# Patient Record
Sex: Male | Born: 1960
Health system: Southern US, Community
[De-identification: ages and names within clinical notes are randomized; demographics above are authoritative.]

## PROBLEM LIST (undated history)

## (undated) DIAGNOSIS — C73 Malignant neoplasm of thyroid gland: Secondary | ICD-10-CM

## (undated) DIAGNOSIS — F411 Generalized anxiety disorder: Secondary | ICD-10-CM

## (undated) DIAGNOSIS — Z96659 Presence of unspecified artificial knee joint: Secondary | ICD-10-CM

## (undated) DIAGNOSIS — R0989 Other specified symptoms and signs involving the circulatory and respiratory systems: Secondary | ICD-10-CM

## (undated) DIAGNOSIS — G4700 Insomnia, unspecified: Secondary | ICD-10-CM

## (undated) DIAGNOSIS — G4733 Obstructive sleep apnea (adult) (pediatric): Principal | ICD-10-CM

## (undated) DIAGNOSIS — I10 Essential (primary) hypertension: Secondary | ICD-10-CM

## (undated) DIAGNOSIS — M5432 Sciatica, left side: Secondary | ICD-10-CM

## (undated) DIAGNOSIS — M1990 Unspecified osteoarthritis, unspecified site: Secondary | ICD-10-CM

## (undated) DIAGNOSIS — D899 Disorder involving the immune mechanism, unspecified: Secondary | ICD-10-CM

## (undated) DIAGNOSIS — L4050 Arthropathic psoriasis, unspecified: Secondary | ICD-10-CM

## (undated) DIAGNOSIS — N529 Male erectile dysfunction, unspecified: Secondary | ICD-10-CM

## (undated) DIAGNOSIS — E785 Hyperlipidemia, unspecified: Secondary | ICD-10-CM

## (undated) DIAGNOSIS — E039 Hypothyroidism, unspecified: Secondary | ICD-10-CM

## (undated) HISTORY — DX: Hyperlipidemia, unspecified: E78.5

## (undated) HISTORY — DX: Arthropathic psoriasis, unspecified: L40.50

## (undated) HISTORY — PX: KNEE SURGERY: SHX244

## (undated) HISTORY — PX: LUMBAR DISC SURGERY: SHX700

## (undated) HISTORY — DX: Male erectile dysfunction, unspecified: N52.9

## (undated) HISTORY — DX: Unspecified osteoarthritis, unspecified site: M19.90

## (undated) HISTORY — DX: Insomnia, unspecified: G47.00

## (undated) HISTORY — DX: Other specified symptoms and signs involving the circulatory and respiratory systems: R09.89

## (undated) HISTORY — DX: Hypothyroidism, unspecified: E03.9

## (undated) HISTORY — DX: Presence of unspecified artificial knee joint: Z96.659

## (undated) HISTORY — DX: Essential (primary) hypertension: I10

## (undated) HISTORY — DX: Disorder involving the immune mechanism, unspecified: D89.9

## (undated) HISTORY — DX: Generalized anxiety disorder: F41.1

## (undated) HISTORY — DX: Sciatica, left side: M54.32

## (undated) HISTORY — DX: Malignant neoplasm of thyroid gland: C73

## (undated) HISTORY — DX: Obstructive sleep apnea (adult) (pediatric): G47.33

---
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
## 1998-06-23 HISTORY — PX: THYROIDECTOMY: SHX17

## 1998-06-26 ENCOUNTER — Encounter: Payer: Self-pay | Admitting: Endocrinology

## 1998-06-26 ENCOUNTER — Ambulatory Visit (HOSPITAL_COMMUNITY): Admission: RE | Admit: 1998-06-26 | Discharge: 1998-06-26 | Payer: Self-pay | Admitting: Endocrinology

## 1998-08-30 ENCOUNTER — Other Ambulatory Visit: Admission: RE | Admit: 1998-08-30 | Discharge: 1998-08-30 | Payer: Self-pay | Admitting: Endocrinology

## 1998-10-01 ENCOUNTER — Ambulatory Visit (HOSPITAL_COMMUNITY): Admission: RE | Admit: 1998-10-01 | Discharge: 1998-10-02 | Payer: Self-pay

## 1998-11-30 ENCOUNTER — Ambulatory Visit (HOSPITAL_COMMUNITY): Admission: RE | Admit: 1998-11-30 | Discharge: 1998-11-30 | Payer: Self-pay | Admitting: Endocrinology

## 1998-12-03 ENCOUNTER — Encounter: Payer: Self-pay | Admitting: Endocrinology

## 1998-12-04 ENCOUNTER — Ambulatory Visit (HOSPITAL_COMMUNITY): Admission: RE | Admit: 1998-12-04 | Discharge: 1998-12-04 | Payer: Self-pay | Admitting: Endocrinology

## 1998-12-04 ENCOUNTER — Encounter: Payer: Self-pay | Admitting: Endocrinology

## 1999-08-26 ENCOUNTER — Encounter: Payer: Self-pay | Admitting: Endocrinology

## 1999-08-26 ENCOUNTER — Ambulatory Visit (HOSPITAL_COMMUNITY): Admission: RE | Admit: 1999-08-26 | Discharge: 1999-08-26 | Payer: Self-pay | Admitting: Endocrinology

## 1999-08-30 ENCOUNTER — Ambulatory Visit (HOSPITAL_COMMUNITY): Admission: RE | Admit: 1999-08-30 | Discharge: 1999-08-30 | Payer: Self-pay | Admitting: Endocrinology

## 2003-06-24 HISTORY — PX: OTHER SURGICAL HISTORY: SHX169

## 2013-02-11 LAB — HM COLONOSCOPY: HM Colonoscopy: NORMAL

## 2013-06-23 HISTORY — PX: CERVICAL DISCECTOMY: SHX98

## 2014-02-17 ENCOUNTER — Encounter: Payer: Self-pay | Admitting: Family Medicine

## 2014-02-17 ENCOUNTER — Other Ambulatory Visit: Payer: Self-pay | Admitting: Family Medicine

## 2014-02-17 ENCOUNTER — Ambulatory Visit (INDEPENDENT_AMBULATORY_CARE_PROVIDER_SITE_OTHER): Payer: 59 | Admitting: Family Medicine

## 2014-02-17 VITALS — BP 138/78 | HR 96 | Temp 98.4°F | Ht 72.0 in | Wt 232.0 lb

## 2014-02-17 DIAGNOSIS — D849 Immunodeficiency, unspecified: Secondary | ICD-10-CM | POA: Insufficient documentation

## 2014-02-17 DIAGNOSIS — G4700 Insomnia, unspecified: Secondary | ICD-10-CM

## 2014-02-17 DIAGNOSIS — Z96659 Presence of unspecified artificial knee joint: Secondary | ICD-10-CM | POA: Insufficient documentation

## 2014-02-17 DIAGNOSIS — M5432 Sciatica, left side: Secondary | ICD-10-CM

## 2014-02-17 DIAGNOSIS — L4050 Arthropathic psoriasis, unspecified: Secondary | ICD-10-CM | POA: Insufficient documentation

## 2014-02-17 DIAGNOSIS — N529 Male erectile dysfunction, unspecified: Secondary | ICD-10-CM | POA: Insufficient documentation

## 2014-02-17 DIAGNOSIS — F411 Generalized anxiety disorder: Secondary | ICD-10-CM

## 2014-02-17 DIAGNOSIS — E039 Hypothyroidism, unspecified: Secondary | ICD-10-CM | POA: Insufficient documentation

## 2014-02-17 DIAGNOSIS — D899 Disorder involving the immune mechanism, unspecified: Secondary | ICD-10-CM

## 2014-02-17 DIAGNOSIS — I10 Essential (primary) hypertension: Secondary | ICD-10-CM | POA: Insufficient documentation

## 2014-02-17 DIAGNOSIS — M5430 Sciatica, unspecified side: Secondary | ICD-10-CM

## 2014-02-17 DIAGNOSIS — Z23 Encounter for immunization: Secondary | ICD-10-CM

## 2014-02-17 HISTORY — DX: Immunodeficiency, unspecified: D84.9

## 2014-02-17 HISTORY — DX: Sciatica, left side: M54.32

## 2014-02-17 HISTORY — DX: Generalized anxiety disorder: F41.1

## 2014-02-17 HISTORY — DX: Arthropathic psoriasis, unspecified: L40.50

## 2014-02-17 HISTORY — DX: Hypothyroidism, unspecified: E03.9

## 2014-02-17 HISTORY — DX: Insomnia, unspecified: G47.00

## 2014-02-17 HISTORY — DX: Male erectile dysfunction, unspecified: N52.9

## 2014-02-17 HISTORY — DX: Presence of unspecified artificial knee joint: Z96.659

## 2014-02-17 LAB — HM COLONOSCOPY: HM Colonoscopy: NORMAL

## 2014-02-17 MED ORDER — PREDNISONE 20 MG PO TABS: ORAL_TABLET | ORAL | Status: DC

## 2014-02-17 MED ORDER — LISINOPRIL 5 MG PO TABS: 5.0000 mg | ORAL_TABLET | Freq: Every day | ORAL | Status: DC

## 2014-02-17 MED ORDER — DULOXETINE HCL 60 MG PO CPEP: 60.0000 mg | ORAL_CAPSULE | Freq: Two times a day (BID) | ORAL | Status: DC

## 2014-02-17 MED ORDER — SILDENAFIL CITRATE 50 MG PO TABS: 50.0000 mg | ORAL_TABLET | Freq: Every day | ORAL | Status: DC | PRN

## 2014-02-17 NOTE — Progress Notes (Signed)
Jared Reddish, MD Phone: [phone]  Subjective:  Patient presents today to establish care with me as PCP. Formerly patient cared for in [place], [place]. Chief complaint-noted.   Patient continues to follow with his endocrinologist in [place] for his history thyroid cancer s/p resection. Follows with Dr. Ouida Sills at [facility] for rheumatology. Patient uncertain when he had his last tetanus shot but states at least 5 years ago. He has never had pneumovax while on Enbrel.   Sciatica Left sided in buttocks shooting into left leg. 3-4 weeks of symptoms. Was steadily improvement until patient overdid it last week with a very busy early week (golf, lots of travel). Being seen at [place]. Had discussed possible prednisone dose pack. Patient interested in trying that today ROS- no fecal or urinary incontinence, no saddle anesthesia, no leg weakness  Generalized Anxiety Disorder Stable for years on cymbalta 120mg . Never had CBT. USes xanax very sparingly (1x month max) ROS-no SI or HI  Hypertension BP Readings from Last 3 Encounters:  02/17/14 138/78  Home BP monitoring-no Compliant with medications-yes without side effects ROS-Denies any CP, HA, SOB, blurry vision, LE edema.  The following were reviewed and entered/updated in epic: Past Medical History  Diagnosis Date  . Arthritis   . Thyroid cancer     s/p removal, now hypothyroid  . Hypertension    Patient Active Problem List   Diagnosis Date Noted  . Immunosuppression 02/17/2014    Priority: High  . GAD (generalized anxiety disorder) 02/17/2014    Priority: Medium  . Psoriatic arthritis 02/17/2014    Priority: Medium  . Hypothyroidism 02/17/2014    Priority: Medium  . Insomnia 02/17/2014    Priority: Medium  . Essential hypertension, benign 02/17/2014    Priority: Medium  . Erectile dysfunction 02/17/2014    Priority: Low  . Total knee replacement status 02/17/2014    Priority: Low  .  Sciatica of left side 02/17/2014    Priority: Low   Past Surgical History  Procedure Laterality Date  . Thyroidectomy Bilateral 2000  . Knee surgery Right 1980-2011  . Shoulder surgery Right 1983  . Right foot Right 2005    Family History  Problem Relation Age of Onset  . Cancer Mother   . Hypertension Mother   . Hypertension Father     Medications- reviewed and updated Current Outpatient Prescriptions  Medication Sig Dispense Refill  . ALPRAZolam (XANAX) 0.25 MG tablet Take 0.25 mg by mouth at bedtime as needed for anxiety.      . DULoxetine (CYMBALTA) 60 MG capsule Take 1 capsule (60 mg total) by mouth 2 (two) times daily.  180 capsule  3  . etanercept (ENBREL SURECLICK) 50 MG/ML injection Inject 50 mg into the skin once a week.      . levothyroxine (SYNTHROID, LEVOTHROID) 125 MCG tablet Take 125 mcg by mouth daily before breakfast.      . lisinopril (PRINIVIL,ZESTRIL) 5 MG tablet Take 1 tablet (5 mg total) by mouth daily.  90 tablet  3  . zolpidem (AMBIEN) 10 MG tablet Take 10 mg by mouth at bedtime as needed for sleep.      . clindamycin (CLEOCIN) 150 MG capsule Take 150 mg by mouth as needed.      . predniSONE (DELTASONE) 20 MG tablet Take 2 pills for 3 days, then 1 pill for 3 days, then 1/2 pill for 3 days, then 1/2 pill every other day until finished.  14 tablet  0  . sildenafil (  VIAGRA) 50 MG tablet Take 1 tablet (50 mg total) by mouth daily as needed for erectile dysfunction.  10 tablet  5   No current facility-administered medications for this visit.   Allergies-reviewed and updated No Known Allergies  History   Social History  . Marital Status: Married    Spouse Name: N/A    Number of Children: N/A  . Years of Education: N/A   Social History Main Topics  . Smoking status: Never Smoker   . Smokeless tobacco: Never Used  . Alcohol Use: 4.0 - 5.0 oz/week    8-10 drink(s) per week  . Drug Use: No  . Sexual Activity: Yes    Partners: Female   Other Topics  Concern  . None   Social History Narrative   Married for 28 years in 2015. 3 kids-2 in [employer] day school, 1 at [employer]. One son hoping to play college baseball.       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 Oropharynx normal Neck: no thyromegaly, no lymphadenopathy CV: RRR no murmurs rubs or gallops Lungs: CTAB no crackles, wheeze, rhonchi Abdomen: soft/nontender/nondistended/normal bowel sounds. No rebound or guarding.  Ext: no edema Skin: warm, dry, no rash Neuro: PERRLA, strength 5/5 upper and lower extremities, normal reflexes  Assessment/Plan:  Sciatica of left side X 3-4 weeks. Seeing chiropractor. Trial prednisone taper. Consider imaging if red flags or if persists past 6 weeks  GAD (generalized anxiety disorder) Well controlled. Refill cymbalta. Will prescribe occasional xanax, if symptoms worsen, need to add CBT.   Obtain records from previous practice in [place] Next visit: review labs, discuss daily ASA, review need for statin  Orders Placed This Encounter  Procedures  . Tdap vaccine greater than or equal to 7yo IM    Meds ordered this encounter  Medications  . etanercept (ENBREL SURECLICK) 50 MG/ML injection    Sig: Inject 50 mg into the skin once a week.  [name] DISCONTD: DULoxetine (CYMBALTA) 60 MG capsule    Sig: Take 60 mg by mouth 2 (two) times daily.  . clindamycin (CLEOCIN) 150 MG capsule    Sig: Take 150 mg by mouth as needed.  [name] DISCONTD: lisinopril (PRINIVIL,ZESTRIL) 5 MG tablet    Sig: Take 5 mg by mouth daily.  [name] ALPRAZolam (XANAX) 0.25  MG tablet    Sig: Take 0.25 mg by mouth at bedtime as needed for anxiety.  [name] zolpidem (AMBIEN) 10 MG tablet    Sig: Take 10 mg by mouth at bedtime as needed for sleep.  [name] DISCONTD: sildenafil (VIAGRA) 50 MG tablet    Sig: Take 50 mg by mouth daily as needed for erectile dysfunction.  [name] levothyroxine (SYNTHROID, LEVOTHROID) 125 MCG tablet    Sig: Take 125 mcg by mouth daily before breakfast.  . predniSONE (DELTASONE) 20 MG tablet    Sig: Take 2 pills for 3 days, then 1 pill for 3 days, then 1/2 pill for 3 days, then 1/2 pill every other day until finished.  Dispense:  14 tablet    Refill:  0  . DULoxetine (CYMBALTA) 60 MG capsule    Sig: Take 1 capsule (60 mg total) by mouth 2 (two) times daily.    Dispense:  180 capsule    Refill:  3  . sildenafil (VIAGRA) 50 MG tablet    Sig: Take 1 tablet (50 mg total) by mouth daily as needed for erectile dysfunction.    Dispense:  10 tablet    Refill:  5  . lisinopril (PRINIVIL,ZESTRIL) 5 MG tablet    Sig: Take 1 tablet (5 mg total) by mouth daily.    Dispense:  90 tablet    Refill:  3

## 2014-02-17 NOTE — Assessment & Plan Note (Signed)
Well controlled. Refill cymbalta. Will prescribe occasional xanax, if symptoms worsen, need to add CBT.

## 2014-02-17 NOTE — Assessment & Plan Note (Signed)
X 3-4 weeks. Seeing chiropractor. Trial prednisone taper. Consider imaging if red flags or if persists past 6 weeks

## 2014-02-17 NOTE — Patient Instructions (Addendum)
Wonderful to meet you!   Prednisone taper prescribed for sciatica. Mobic is ok to take with this as well. Could cause some stomach upset/reflux-zantac would be reasonable if this occurs.   Refilled-cymbalta, viagra, lasix  TDAP today  Front desk-schedule 1 year appointment, ask for medical records-(need office visits, immunizations, labs, colonoscopy, summary sheet if available for last 3 years)  TIps for losing weight 1. Cut out sugar substitutes (coke zero, stevia) as may be driving your after dinner snacking 2. 150 minutes exercise per week 3. Reduce post meal snacking by 50% or replace foods with healthy options such as fruits, veggies  Goal weight 210-220 (BMI under 30)  Return in 1 year (or 6 months to check in on weight)

## 2014-02-17 NOTE — Assessment & Plan Note (Signed)
Well controlled. Refilled lisinopril. Obtain records with labs.

## 2014-03-10 ENCOUNTER — Other Ambulatory Visit: Payer: Self-pay | Admitting: Sports Medicine

## 2014-03-10 DIAGNOSIS — M545 Low back pain: Secondary | ICD-10-CM

## 2014-03-19 ENCOUNTER — Ambulatory Visit
Admission: RE | Admit: 2014-03-19 | Discharge: 2014-03-19 | Disposition: A | Discharge status: Home / Self Care | Payer: 59 | Source: Ambulatory Visit | Attending: Sports Medicine | Admitting: Sports Medicine

## 2014-03-19 DIAGNOSIS — M545 Low back pain: Secondary | ICD-10-CM

## 2014-04-11 ENCOUNTER — Encounter: Payer: Self-pay | Admitting: Family Medicine

## 2015-01-19 ENCOUNTER — Other Ambulatory Visit: Payer: Self-pay | Admitting: Family Medicine

## 2015-02-15 ENCOUNTER — Ambulatory Visit (INDEPENDENT_AMBULATORY_CARE_PROVIDER_SITE_OTHER): Payer: 59 | Admitting: Family Medicine

## 2015-02-15 ENCOUNTER — Encounter: Payer: Self-pay | Admitting: Family Medicine

## 2015-02-15 VITALS — BP 110/74 | HR 87 | Temp 98.5°F | Ht 72.0 in | Wt 226.0 lb

## 2015-02-15 DIAGNOSIS — Z0000 Encounter for general adult medical examination without abnormal findings: Secondary | ICD-10-CM | POA: Diagnosis not present

## 2015-02-15 DIAGNOSIS — R7989 Other specified abnormal findings of blood chemistry: Secondary | ICD-10-CM | POA: Diagnosis not present

## 2015-02-15 LAB — PSA: PSA: 1.59 ng/mL (ref 0.10–4.00)

## 2015-02-15 LAB — COMPREHENSIVE METABOLIC PANEL
ALK PHOS: 80 U/L (ref 39–117)
ALT: 33 U/L (ref 0–53)
AST: 25 U/L (ref 0–37)
Albumin: 4.7 g/dL (ref 3.5–5.2)
BILIRUBIN TOTAL: 0.8 mg/dL (ref 0.2–1.2)
BUN: 18 mg/dL (ref 6–23)
CALCIUM: 9.9 mg/dL (ref 8.4–10.5)
CO2: 32 meq/L (ref 19–32)
Chloride: 99 mEq/L (ref 96–112)
Creatinine, Ser: 1.24 mg/dL (ref 0.40–1.50)
GFR: 65.03 mL/min (ref >60.00)
Glucose, Bld: 98 mg/dL (ref 70–99)
Potassium: 4.6 mEq/L (ref 3.5–5.1)
Sodium: 140 mEq/L (ref 135–145)
Total Protein: 7.6 g/dL (ref 6.0–8.3)

## 2015-02-15 LAB — CBC
HCT: 47.2 % (ref 39.0–52.0)
HEMOGLOBIN: 16.2 g/dL (ref 13.0–17.0)
MCHC: 34.2 g/dL (ref 30.0–36.0)
MCV: 89.6 fl (ref 78.0–100.0)
PLATELETS: 252 10*3/uL (ref 150.0–400.0)
RBC: 5.27 Mil/uL (ref 4.22–5.81)
RDW: 12.4 % (ref 11.5–15.5)
WBC: 5 10*3/uL (ref 4.0–10.5)

## 2015-02-15 LAB — LIPID PANEL
Cholesterol: 243 mg/dL — ABNORMAL HIGH (ref 0–200)
HDL: 51 mg/dL (ref >39.00)
NonHDL: 192.13
TRIGLYCERIDES: 279 mg/dL — AB (ref 0.0–149.0)
Total CHOL/HDL Ratio: 5
VLDL: 55.8 mg/dL — ABNORMAL HIGH (ref 0.0–40.0)

## 2015-02-15 LAB — LDL CHOLESTEROL, DIRECT: Direct LDL: 152 mg/dL

## 2015-02-15 LAB — TSH: TSH: 0.24 u[IU]/mL — ABNORMAL LOW (ref 0.35–4.50)

## 2015-02-15 MED ORDER — DULOXETINE HCL 60 MG PO CPEP: 60.0000 mg | ORAL_CAPSULE | Freq: Two times a day (BID) | ORAL | Status: DC

## 2015-02-15 MED ORDER — LISINOPRIL 5 MG PO TABS: 5.0000 mg | ORAL_TABLET | Freq: Every day | ORAL | Status: DC

## 2015-02-15 MED ORDER — ALPRAZOLAM 0.25 MG PO TABS: 0.2500 mg | ORAL_TABLET | Freq: Every evening | ORAL | Status: DC | PRN

## 2015-02-15 MED ORDER — SILDENAFIL CITRATE 50 MG PO TABS: 50.0000 mg | ORAL_TABLET | Freq: Every day | ORAL | Status: DC | PRN

## 2015-02-15 MED ORDER — ZOLPIDEM TARTRATE 10 MG PO TABS: 10.0000 mg | ORAL_TABLET | Freq: Every evening | ORAL | Status: DC | PRN

## 2015-02-15 NOTE — Patient Instructions (Signed)
Medication Instructions:  Same, see list  Other Instructions:  Continue your weight loss journey. Congrats on being 6 lbs down. Continue journey to around 210 with 5 lb increments  Sign release of information at the front desk for colonoscopy report   Labwork: Today before you leave  Testing/Procedures/Immunizations: Flu shot at work- send [place] a message when you get this  Follow-Up (all visit scheduling, rescheduling, cancellations including labs should be scheduled at front desk): 1 year as long as you are checking BP at home at least once a month and under 140/90 and as long as no other issues come up.

## 2015-02-15 NOTE — Progress Notes (Signed)
Jared Reddish, MD Phone: [phone]  Subjective:  Patient presents today for their annual physical. Chief complaint-noted.   Fasting today- black coffee.   Sister recently diagnosed with diabetes. Wants fasting CBG check  Still goes back to [place] once a year check s/p thyroid cancer removal. On levothyroxine through their office  BP controlled- compliant with lisinopril  Considered reducing cymbalta but stress still very high in corporate [place] at [employer]. Xanax once a month usually.   Ambien primarily with travel  ROS- full  review of systems was completed and negative  Pertinent negatives- no chest pain, shortness of breath, headaches, blurry vision  The following were reviewed and entered/updated in epic: Past Medical History  Diagnosis Date  . Arthritis   . Thyroid cancer     s/p removal, now hypothyroid  . Hypertension    Patient Active Problem List   Diagnosis Date Noted  . Immunosuppression 02/17/2014    Priority: High  . GAD (generalized anxiety disorder) 02/17/2014    Priority: Medium  . Psoriatic arthritis 02/17/2014    Priority: Medium  . Hypothyroidism 02/17/2014    Priority: Medium  . Insomnia 02/17/2014    Priority: Medium  . Essential hypertension, benign 02/17/2014    Priority: Medium  . Erectile dysfunction 02/17/2014    Priority: Low  . Total knee replacement status 02/17/2014    Priority: Low  . Sciatica of left side 02/17/2014    Priority: Low   Past Surgical History  Procedure Laterality Date  . Thyroidectomy Bilateral 2000  . Knee surgery Right 1980-2011  . Shoulder surgery Right 1983  . Right foot Right 2005  . Cervical discectomy  2015  . Lumbar disc surgery      l4-l5, l5-s1- ruptured disc. Dr. Ellene Route    Family History  Problem Relation Age of Onset  . Cancer Mother   . Hypertension Mother   . Hypertension Father   . Diabetes Sister     Medications- reviewed and updated Current Outpatient Prescriptions  Medication  Sig Dispense Refill  . ALPRAZolam (XANAX) 0.25 MG tablet Take 0.25 mg by mouth at bedtime as needed for anxiety.    . DULoxetine (CYMBALTA) 60 MG capsule Take 1 capsule (60 mg total) by mouth 2 (two) times daily. 180 capsule 3  . etanercept (ENBREL SURECLICK) 50 MG/ML injection Inject 50 mg into the skin once a week.    . levothyroxine (SYNTHROID, LEVOTHROID) 125 MCG tablet Take 125 mcg by mouth daily before breakfast.    . lisinopril (PRINIVIL,ZESTRIL) 5 MG tablet TAKE 1 TABLET DAILY 90 tablet 2  . clindamycin (CLEOCIN) 150 MG capsule Take 150 mg by mouth as needed.    . sildenafil (VIAGRA) 50 MG tablet Take 1 tablet (50 mg total) by mouth daily as needed for erectile dysfunction. (Patient not taking: Reported on 02/15/2015) 10 tablet 5  . zolpidem (AMBIEN) 10 MG tablet Take 10 mg by mouth at bedtime as needed for sleep.     Allergies-reviewed and updated No Known Allergies  Social History   Social History  . Marital Status: Married    Spouse Name: N/A  . Number of Children: N/A  . Years of Education: N/A   Social History Main Topics  . Smoking status: Never Smoker   . Smokeless tobacco: Never Used  . Alcohol Use: 4.0 - 5.0 oz/week    8-10 drink(s) per week  . Drug Use: No  . Sexual Activity:    Partners: Female   Other Topics Concern  .  None   Social History Narrative   Married for 29 years in 2016. 3 kids-1 in [employer] day school, 1 at [employer], 1 at [employer]- injury  Preventing from playing baseball.       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 Oropharynx normal Neck: no thyromegaly (no thyroid present) CV: RRR no murmurs rubs or gallops Lungs: CTAB no  crackles, wheeze, rhonchi Abdomen: soft/nontender/nondistended/normal bowel sounds. No rebound or guarding.  Rectal: normal tone, normal prostate, no masses or tenderness Ext: no edema, 2+ PT pulses Skin: warm, dry, no rash. Trunk skin exam without obvious BCC, SCC, AK, melanoma Neuro: grossly normal, moves all extremities, PERRLA  Assessment/Plan:  54 y.o. male presenting for annual physical.  Health Maintenance counseling: 1. Anticipatory guidance: Patient counseled regarding regular dental exams, wearing seatbelts, wearing sunscreen. Will have skin check in February with dermatologist.  2. Risk factor reduction:  Advised patient of need for regular exercise and diet rich and fruits and vegetables to reduce risk of heart attack and stroke. 6 lbs down- worked on portion size, working on having some more protein in AM, no late night snacking. Walking a lot.  3. Immunizations/screenings/ancillary studies Health Maintenance Due  Topic Date Due  . Hepatitis C Screening - declined 07[birth year]62  . HIV Screening - declined 12/20/1977  4. Prostate cancer screening- PSA today and rectal exam (rectal low risk). Still opts every other year screening 5. Colonoscopy Dr. Mertie Clause in [place] 02/17/14- normal, was told 10 years. No family history.   See HPI as well 1 year f/u as long as monitoring home BPs and <140/90  Orders Placed This Encounter  Procedures  . CBC    [place]  . Comprehensive metabolic panel    [place]    Order Specific Question:  Has the patient fasted?    Answer:  No  . Lipid panel    [place]    Order Specific Question:  Has the patient fasted?    Answer:  No  . PSA  . TSH    [place]    Meds ordered this encounter  Medications  . zolpidem (AMBIEN) 10 MG tablet    Sig: Take 1 tablet (10 mg total) by mouth at bedtime as needed for sleep.    Dispense:  30 tablet    Refill:  0  . ALPRAZolam (XANAX) 0.25 MG tablet    Sig: Take 1 tablet (0.25 mg total) by mouth at bedtime  as needed for anxiety.    Dispense:  30 tablet    Refill:  0  . lisinopril (PRINIVIL,ZESTRIL) 5 MG tablet    Sig: Take 1 tablet (5 mg total) by mouth daily.    Dispense:  90 tablet    Refill:  3  . sildenafil (VIAGRA) 50 MG tablet    Sig: Take 1 tablet (50 mg total) by mouth daily as needed for erectile dysfunction.    Dispense:  10 tablet    Refill:  5  . DULoxetine (CYMBALTA) 60 MG capsule    Sig: Take 1 capsule (60 mg total) by mouth 2 (two) times daily.  Dispense:  180 capsule    Refill:  3

## 2015-02-16 ENCOUNTER — Encounter: Payer: 59 | Admitting: Family Medicine

## 2015-02-27 ENCOUNTER — Encounter: Payer: Self-pay | Admitting: Family Medicine

## 2015-07-30 ENCOUNTER — Other Ambulatory Visit: Payer: Self-pay

## 2015-07-30 NOTE — Telephone Encounter (Signed)
Patient has requested refill on Ambien. Last refilled on 02/15/15 for #30 with 0 refills. Ok to refill this medication?

## 2015-07-30 NOTE — Telephone Encounter (Signed)
Refill ok? 

## 2015-07-31 MED ORDER — ZOLPIDEM TARTRATE 10 MG PO TABS: 10.0000 mg | ORAL_TABLET | Freq: Every evening | ORAL | Status: DC | PRN

## 2015-07-31 NOTE — Telephone Encounter (Signed)
I called [facility] and they stated the Rx cannot be called in for the pt.  The Rx was printed and given to Dr Inda Merlin to sign for Dr Yong Channel and this was faxed to [facility] at

## 2015-07-31 NOTE — Telephone Encounter (Signed)
Dr Yong Channel called [facility] and approved a refill and this was called to the pts pharmacy.

## 2015-08-22 LAB — CBC AND DIFFERENTIAL
HEMATOCRIT: 45 % (ref 41–53)
HEMOGLOBIN: 15.6 g/dL (ref 13.5–17.5)
PLATELETS: 230 10*3/uL (ref 150–399)
WBC: 6 10*3/mL

## 2015-08-22 LAB — HEPATIC FUNCTION PANEL
ALT: 29 U/L (ref 10–40)
AST: 30 U/L (ref 14–40)
Alkaline Phosphatase: 76 U/L (ref 25–125)
BILIRUBIN, TOTAL: 0.4 mg/dL

## 2015-08-22 LAB — BASIC METABOLIC PANEL
BUN: 21 mg/dL (ref 4–21)
Creatinine: 1.3 mg/dL (ref 0.6–1.3)

## 2015-08-24 ENCOUNTER — Encounter: Payer: Self-pay | Admitting: Family Medicine

## 2015-11-14 ENCOUNTER — Other Ambulatory Visit: Payer: Self-pay

## 2015-11-14 NOTE — Telephone Encounter (Signed)
PT IS REQUESTING A REFILL. LAST REFILL 07/31/15. LAST OV 02/15/15. OK TO REFILL?

## 2015-11-14 NOTE — Telephone Encounter (Signed)
Yes thanks 

## 2015-11-15 MED ORDER — ZOLPIDEM TARTRATE 10 MG PO TABS: 10.0000 mg | ORAL_TABLET | Freq: Every evening | ORAL | Status: DC | PRN

## 2015-11-15 NOTE — Telephone Encounter (Signed)
Pt requested rx be called to [facility].  I did so, pt advised and voiced understanding.

## 2015-11-20 ENCOUNTER — Other Ambulatory Visit: Payer: Self-pay | Admitting: *Deleted

## 2015-11-20 NOTE — Telephone Encounter (Signed)
Yes thanks may refill 

## 2015-11-21 ENCOUNTER — Ambulatory Visit (HOSPITAL_COMMUNITY)
Admission: RE | Admit: 2015-11-21 | Discharge: 2015-11-21 | Disposition: A | Discharge status: Home / Self Care | Payer: 59 | Source: Ambulatory Visit | Attending: Internal Medicine | Admitting: Internal Medicine

## 2015-11-21 VITALS — BP 132/78 | HR 100 | Ht 72.0 in | Wt 226.8 lb

## 2015-11-21 DIAGNOSIS — Z880 Allergy status to penicillin: Secondary | ICD-10-CM | POA: Diagnosis not present

## 2015-11-21 DIAGNOSIS — E8881 Metabolic syndrome: Secondary | ICD-10-CM | POA: Insufficient documentation

## 2015-11-21 DIAGNOSIS — R0683 Snoring: Secondary | ICD-10-CM | POA: Insufficient documentation

## 2015-11-21 DIAGNOSIS — Z833 Family history of diabetes mellitus: Secondary | ICD-10-CM | POA: Insufficient documentation

## 2015-11-21 DIAGNOSIS — Z79899 Other long term (current) drug therapy: Secondary | ICD-10-CM | POA: Diagnosis not present

## 2015-11-21 DIAGNOSIS — E785 Hyperlipidemia, unspecified: Secondary | ICD-10-CM

## 2015-11-21 DIAGNOSIS — E890 Postprocedural hypothyroidism: Secondary | ICD-10-CM | POA: Diagnosis not present

## 2015-11-21 DIAGNOSIS — Z85850 Personal history of malignant neoplasm of thyroid: Secondary | ICD-10-CM | POA: Insufficient documentation

## 2015-11-21 DIAGNOSIS — N529 Male erectile dysfunction, unspecified: Secondary | ICD-10-CM | POA: Diagnosis not present

## 2015-11-21 DIAGNOSIS — Z136 Encounter for screening for cardiovascular disorders: Secondary | ICD-10-CM | POA: Insufficient documentation

## 2015-11-21 DIAGNOSIS — I10 Essential (primary) hypertension: Secondary | ICD-10-CM | POA: Diagnosis not present

## 2015-11-21 DIAGNOSIS — Z8249 Family history of ischemic heart disease and other diseases of the circulatory system: Secondary | ICD-10-CM | POA: Insufficient documentation

## 2015-11-21 DIAGNOSIS — L4050 Arthropathic psoriasis, unspecified: Secondary | ICD-10-CM | POA: Insufficient documentation

## 2015-11-21 MED ORDER — ASPIRIN EC 81 MG PO TBEC: 81.0000 mg | DELAYED_RELEASE_TABLET | Freq: Every day | ORAL | Status: AC

## 2015-11-21 MED ORDER — ROSUVASTATIN CALCIUM 20 MG PO TABS: 20.0000 mg | ORAL_TABLET | Freq: Every day | ORAL | Status: DC

## 2015-11-21 NOTE — Patient Instructions (Signed)
Start Aspirin 81 mg daily  Start Crestor 20 mg daily  Your physician has requested that you have an exercise tolerance test. For further information please visit [email or web address]. Please also follow instruction sheet, as given.  Calcium Scoring CT Scan  Your physician recommends that you schedule a follow-up appointment in: 4-6 weeks

## 2015-11-21 NOTE — Progress Notes (Signed)
Patient ID: SEAVER FOSSETT, male   DOB: 09-14-1960, 55 y.o.   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    Has been treated for HTN for 15-20 years. BP relatively well controlled unless under stress. No known heart disease. Never had stress test or cardiac cath. Colleague at [facility] recently with large MI and wanted to get screened.   Used to be very active playing sports. No says he is a "weekend warrior." Does vigorous yard work without CP or SOB but tires easily after 1 hour and can feel sick to his stomach. Occasional palpitations at night. + snoring. Takes sildenafil for ED.   No premature heart disease. Father with with ruptured AAA at 81 y/o.   Last lipids (8/16) - didn't want to start statin yet. Wanted to try weight loss.  TC 243 TG 279 HDL 51 LDL 152   Review of Systems: [y] = yes, [ ]  = no   General: Weight gain [ ] ; Weight loss [ ] ; Anorexia [ ] ; Fatigue [ ] ; Fever [ ] ; Chills [ ] ; Weakness [ ]   Cardiac: Chest pain/pressure [ ] ; Resting SOB [ ] ; Exertional SOB [ ] ; Orthopnea [ ] ; Pedal Edema [ ] ; Palpitations [ ] ; Syncope [ ] ; Presyncope [ ] ; Paroxysmal nocturnal dyspnea[ ]   Pulmonary: Cough [ ] ; Wheezing[ ] ; Hemoptysis[ ] ; Sputum [ ] ; Snoring [ ]   GI: Vomiting[ ] ; Dysphagia[ ] ; Melena[ ] ; Hematochezia [ ] ; Heartburn[ ] ; Abdominal pain [ ] ; Constipation [ ] ; Diarrhea [ ] ; BRBPR [ ]   GU: Hematuria[ ] ; Dysuria [ ] ; Nocturia[ ]   Vascular: Pain in legs with walking [ ] ; Pain in feet with lying flat [ ] ; Non-healing sores [ ] ; Stroke [ ] ; TIA [ ] ; Slurred speech [ ] ;  Neuro: Headaches[ ] ; Vertigo[ ] ; Seizures[ ] ; Paresthesias[ ] ;Blurred vision [ ] ; Diplopia [ ] ; Vision changes [ ]   Ortho/Skin: Arthritis [ y]; Joint pain Blue.Reese ]; Muscle pain [ ] ;  Joint swelling [ ] ; Back Pain [ ] ; Rash [ ]   Psych: Depression[ ] ; Anxiety[ ]   Heme: Bleeding problems [ ] ; Clotting disorders [ ] ; Anemia [ ]   Endocrine: Diabetes [ ] ; Thyroid dysfunction[y ]   Past Medical History  Diagnosis Date  . Arthritis   . Thyroid cancer     s/p removal, now hypothyroid  . Hypertension     Current Outpatient Prescriptions  Medication Sig Dispense Refill  . ALPRAZolam (XANAX) 0.25 MG tablet Take 1 tablet (0.25 mg total) by mouth at bedtime as needed for anxiety. 30 tablet 0  . Calcium Carbonate-Vitamin D (CALCIUM-VITAMIN D) 500-200 MG-UNIT tablet Take 1 tablet by mouth daily.    . cholecalciferol (VITAMIN D) 1000 units tablet Take 2,000 Units by mouth daily.    . clindamycin (CLEOCIN) 150 MG capsule Take 150 mg by mouth as needed.    . DULoxetine (CYMBALTA) 60 MG capsule Take 1 capsule (60 mg total) by mouth 2 (two) times daily. 180 capsule 3  . etanercept (ENBREL SURECLICK) 50 MG/ML injection Inject 50 mg into the skin once a week.    . levothyroxine (SYNTHROID, LEVOTHROID) 125 MCG tablet Take 125 mcg by mouth 2 (two) times daily.    [name] lisinopril (PRINIVIL,ZESTRIL) 5 MG tablet Take 1 tablet (  5 mg total) by mouth daily. 90 tablet 3  . Multiple Vitamin (MULTIVITAMIN) capsule Take 1 capsule by mouth daily.    . sildenafil (VIAGRA) 50 MG tablet Take 1 tablet (50 mg total) by mouth daily as needed for erectile dysfunction. 10 tablet 5  . zolpidem (AMBIEN) 10 MG tablet Take 1 tablet (10 mg total) by mouth at bedtime as needed for sleep. 30 tablet 2   No current facility-administered medications for this encounter.    Allergies  Allergen Reactions  . Penicillins Rash      Social History   Social History  . Marital Status: Married    Spouse Name: N/A  . Number of Children: N/A  . Years of Education: N/A   Occupational History  . Not on file.   Social History Main Topics  . Smoking status: Never Smoker   . Smokeless tobacco: Never Used  . Alcohol  Use: 4.0 - 5.0 oz/week    8-10 drink(s) per week  . Drug Use: No  . Sexual Activity:    Partners: Female   Other Topics Concern  . Not on file   Social History Narrative   Married for 29 years in 2016. 3 kids-1 in [employer] day school, 1 at [employer], 1 at [employer]- injury  Preventing from playing baseball.       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 Cancer Mother   . Hypertension Mother   . Hypertension Father   . Diabetes Sister   Father with ruptured AAA in 6s  Filed Vitals:   11/21/15 1154  BP: 132/78  Pulse: 100  Height: 6' (1.829 m)  Weight: 226 lb 12 oz (102.853 kg)  SpO2: 99%    PHYSICAL EXAM: General:  Well appearing. No respiratory difficulty HEENT: normal Neck: supple. no JVD. Carotids 2+ bilat; no bruits. No lymphadenopathy or thryomegaly appreciated. Cor: PMI nondisplaced. Regular rate & rhythm. No rubs, gallops or murmurs. Lungs: clear Abdomen: soft, nontender, nondistended. No hepatosplenomegaly. No bruits or masses. Good bowel sounds. Extremities: no cyanosis, clubbing, rash, edema Neuro: alert & oriented x 3, cranial nerves grossly intact. moves all 4 extremities w/o difficulty. Affect pleasant.  ECG: NSR 88 No ST-T wave abnormalities.    ASSESSMENT & PLAN: 1. Cardiac screening 2. Hyperlipidemia with metabolic syndrome 3. Erectile dysfunction 4. HTN 5. Snoring, possible OSA  He has multiple cardiovascular risk factors and evidence of early vascular disease (ED). Will proceed with ETT and coronary calcium scoring. Start Crestor 20 and ECASA 81 daily. Discussed need for regular CV exercise and weight loss. Also showed him F3 info.   Seaborn Nakama,MD 11:05 PM

## 2015-11-22 DIAGNOSIS — E785 Hyperlipidemia, unspecified: Secondary | ICD-10-CM | POA: Insufficient documentation

## 2015-11-22 HISTORY — DX: Hyperlipidemia, unspecified: E78.5

## 2015-11-22 MED ORDER — ALPRAZOLAM 0.25 MG PO TABS: 0.2500 mg | ORAL_TABLET | Freq: Every evening | ORAL | Status: DC | PRN

## 2015-11-27 ENCOUNTER — Ambulatory Visit (INDEPENDENT_AMBULATORY_CARE_PROVIDER_SITE_OTHER)
Admission: RE | Admit: 2015-11-27 | Discharge: 2015-11-27 | Disposition: A | Discharge status: Home / Self Care | Payer: Self-pay | Source: Ambulatory Visit | Attending: Internal Medicine | Admitting: Internal Medicine

## 2015-11-27 ENCOUNTER — Ambulatory Visit (INDEPENDENT_AMBULATORY_CARE_PROVIDER_SITE_OTHER): Payer: 59

## 2015-11-27 DIAGNOSIS — I10 Essential (primary) hypertension: Secondary | ICD-10-CM | POA: Diagnosis not present

## 2015-11-27 LAB — EXERCISE TOLERANCE TEST
CHL CUP MPHR: 168 {beats}/min
CHL CUP RESTING HR STRESS: 81 {beats}/min
CSEPED: 9 min
CSEPEDS: 0 s
CSEPEW: 10.1 METS
CSEPPHR: 148 {beats}/min
Percent HR: 88 %
RPE: 16

## 2015-11-29 ENCOUNTER — Other Ambulatory Visit: Payer: Self-pay | Admitting: General Practice

## 2015-11-29 ENCOUNTER — Telehealth: Payer: Self-pay | Admitting: General Practice

## 2015-11-29 NOTE — Telephone Encounter (Signed)
Spoke with [facility].  Patient received Alprazolam on 6/6.

## 2015-12-03 ENCOUNTER — Telehealth: Payer: Self-pay | Admitting: Family Medicine

## 2015-12-03 NOTE — Telephone Encounter (Signed)
Ref no.  [id number]  [facility] on  ALPRAZolam (XANAX) 0.25 MG tablet  30 tabs  Please call back and reference number above.

## 2015-12-03 NOTE — Telephone Encounter (Signed)
Called and spoke with pharmacist at [facility]. Confirmed refill for ALPRAZolam (XANAX) 0.25 MG tablet 30 tabs.

## 2015-12-27 ENCOUNTER — Ambulatory Visit (HOSPITAL_COMMUNITY)
Admission: RE | Admit: 2015-12-27 | Discharge: 2015-12-27 | Disposition: A | Discharge status: Home / Self Care | Payer: 59 | Source: Ambulatory Visit | Attending: Internal Medicine | Admitting: Internal Medicine

## 2015-12-27 VITALS — BP 146/80 | HR 90 | Wt 223.5 lb

## 2015-12-27 DIAGNOSIS — R0989 Other specified symptoms and signs involving the circulatory and respiratory systems: Secondary | ICD-10-CM

## 2015-12-27 DIAGNOSIS — I10 Essential (primary) hypertension: Secondary | ICD-10-CM

## 2015-12-27 DIAGNOSIS — E8881 Metabolic syndrome: Secondary | ICD-10-CM | POA: Diagnosis not present

## 2015-12-27 DIAGNOSIS — M1990 Unspecified osteoarthritis, unspecified site: Secondary | ICD-10-CM | POA: Insufficient documentation

## 2015-12-27 DIAGNOSIS — N529 Male erectile dysfunction, unspecified: Secondary | ICD-10-CM | POA: Diagnosis not present

## 2015-12-27 DIAGNOSIS — Z7982 Long term (current) use of aspirin: Secondary | ICD-10-CM | POA: Insufficient documentation

## 2015-12-27 DIAGNOSIS — Z880 Allergy status to penicillin: Secondary | ICD-10-CM | POA: Diagnosis not present

## 2015-12-27 DIAGNOSIS — R0683 Snoring: Secondary | ICD-10-CM | POA: Diagnosis not present

## 2015-12-27 DIAGNOSIS — E785 Hyperlipidemia, unspecified: Secondary | ICD-10-CM | POA: Insufficient documentation

## 2015-12-27 DIAGNOSIS — R002 Palpitations: Secondary | ICD-10-CM | POA: Insufficient documentation

## 2015-12-27 DIAGNOSIS — L4050 Arthropathic psoriasis, unspecified: Secondary | ICD-10-CM | POA: Insufficient documentation

## 2015-12-27 DIAGNOSIS — Z85850 Personal history of malignant neoplasm of thyroid: Secondary | ICD-10-CM | POA: Insufficient documentation

## 2015-12-27 NOTE — Progress Notes (Signed)
Patient ID: Jared Martin, male   DOB: 18-Nov-1960, 55 y.o.   MRN: [id number] Patient ID: Jared Martin, male   DOB: 01/26/1961, 55 y.o.   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    Has been treated for HTN for 15-20 years. BP relatively well controlled unless under stress. No known heart disease. Never had stress test or cardiac cath. Colleague at [facility] recently with large MI and wanted to get screened.   Used to be very active playing sports. No says he is a "weekend warrior." Does vigorous yard work without CP or SOB but tires easily after 1 hour and can feel sick to his stomach. Occasional palpitations at night. + snoring. Takes sildenafil for ED. No premature heart disease. Father with with ruptured AAA at 31 y/o.   Here for f/u: Feels great. Being mindful about diet and exercise. Working out 2x/week. Doing 20-25 mins on the exercise bike. No CP or SOB. Has lost 4 pounds.  Cut back on red meat and pizza. Eating more fish. Tolerating crestor and ASA.  Getting minimum 12K steps per day.    Last lipids (8/16) - didn't want to start statin yet. Wanted to try weight loss.  TC 243 TG 279 HDL 51 LDL 152  Calcium scoring  FINDINGS: Non-cardiac: No significant non cardiac findings on limited lung and soft tissue windows. See separate report from [facility]. Ascending Aorta: 4.1 cm Pericardium: Normal Coronary arteries: Small plaque in mid LAD but did not meet density criteria to be scored IMPRESSION: Coronary calcium score of 0. Mild aortic root dilatation  ETT 6/17   The patient walked on a standard Bruce protocol for 9 minutes.  He acheived a peak HR of 148 which is 88% predicted maximal HR  His BP response to exercise was  hypertensive  There were no ST or T wave changes to suggest ischemia  Negative GXT. Hypertensive response to exercise.      Past Medical History  Diagnosis Date  . Arthritis   . Thyroid cancer     s/p removal, now hypothyroid  . Hypertension     Current Outpatient Prescriptions  Medication Sig Dispense Refill  . ALPRAZolam (XANAX) 0.25 MG tablet Take 1 tablet (0.25 mg total) by mouth at bedtime as needed for anxiety. 30 tablet 0  . aspirin EC 81 MG tablet Take 1 tablet (81 mg total) by mouth daily. 90 tablet 3  . Calcium Carbonate-Vitamin D (CALCIUM-VITAMIN D) 500-200 MG-UNIT tablet Take 1 tablet by mouth daily.    . cholecalciferol (VITAMIN D) 1000 units tablet Take 2,000 Units by mouth daily.    . clindamycin (CLEOCIN) 150 MG capsule Take 150 mg by mouth as needed.    . DULoxetine (CYMBALTA) 60 MG capsule Take 1 capsule (60 mg total) by mouth 2 (two) times daily. 180 capsule 3  . etanercept (ENBREL SURECLICK) 50 MG/ML injection Inject 50 mg into the skin once a week.    . levothyroxine (SYNTHROID, LEVOTHROID) 125 MCG tablet Take 125 mcg by mouth 2 (two) times daily.    [name] lisinopril (PRINIVIL,ZESTRIL) 5 MG tablet Take 1 tablet (5 mg total) by mouth daily. 90 tablet 3  . Multiple Vitamin (MULTIVITAMIN) capsule Take 1 capsule by mouth daily.    [name]  rosuvastatin (CRESTOR) 20 MG tablet Take 1 tablet (20 mg total) by mouth daily. 90 tablet 3  . sildenafil (VIAGRA) 50 MG tablet Take 1 tablet (50 mg total) by mouth daily as needed for erectile dysfunction. 10 tablet 5  . zolpidem (AMBIEN) 10 MG tablet Take 1 tablet (10 mg total) by mouth at bedtime as needed for sleep. 30 tablet 2   No current facility-administered medications for this encounter.    Allergies  Allergen Reactions  . Penicillins Rash      Social History   Social History  . Marital Status: Married    Spouse Name: N/A  . Number of Children: N/A  . Years of Education: N/A   Occupational History  . Not on file.     Social History Main Topics  . Smoking status: Never Smoker   . Smokeless tobacco: Never Used  . Alcohol Use: 4.0 - 5.0 oz/week    8-10 drink(s) per week  . Drug Use: No  . Sexual Activity:    Partners: Female   Other Topics Concern  . Not on file   Social History Narrative   Married for 29 years in 2016. 3 kids-1 in [place] day school, 1 at [place], 1 at [employer]- injury  Preventing from playing baseball.       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 Cancer Mother   . Hypertension Mother   . Hypertension Father   . Diabetes Sister   Father with ruptured AAA in 65s  Filed Vitals:   12/27/15 1215  BP: 146/80  Pulse: 90  Weight: 223 lb 8 oz (101.379 kg)  SpO2: 99%    PHYSICAL EXAM: General:  Well appearing. No respiratory difficulty HEENT: normal Neck: supple. no JVD. Carotids 2+ bilat; no bruits. No lymphadenopathy or thryomegaly appreciated. Cor: PMI nondisplaced. Regular rate & rhythm. No rubs, gallops or murmurs. Lungs: clear Abdomen: soft, nontender, nondistended. No hepatosplenomegaly. No bruits or masses. Good bowel sounds. ?soft ab bruit Extremities: no cyanosis, clubbing, rash, edema Neuro: alert & oriented x 3, cranial nerves grossly intact. moves all 4 extremities w/o difficulty. Affect pleasant.  ECG: NSR 88 No ST-T wave abnormalities.    ASSESSMENT & PLAN: 1. Cardiac risk factor profile --Calcium score = 0 but with mild soft plaque. Stressed need to improve cardiac RFs and stabilize plaque --Exercise 40 min 4x/week. Crestor, Continue ASA 2. Hyperlipidemia with metabolic syndrome --Continue exercise . Recheck lipids 3. Erectile dysfunction 4. HTN --Possible white-coat syndrome. He will follow more closely at work 5. Snoring, possible OSA 6. Ab bruit  --check aortic u/s 7. Dilated  aortic root (4.1 cm) on cardiac CT --check echo in 6 months  Bensimhon, Daniel,MD 12:44 PM

## 2015-12-27 NOTE — Patient Instructions (Signed)
Your physician recommends that you return for a FASTING lipid profile:   Your physician has requested that you have an abdominal aorta duplex. During this test, an ultrasound is used to evaluate the aorta. Allow 30 minutes for this exam. Do not eat after midnight the day before and avoid carbonated beverages  We will contact you in 4 months to schedule your next appointment.

## 2015-12-27 NOTE — Progress Notes (Signed)
error 

## 2015-12-28 ENCOUNTER — Telehealth (HOSPITAL_COMMUNITY): Payer: Self-pay | Admitting: Vascular Surgery

## 2015-12-28 DIAGNOSIS — R0989 Other specified symptoms and signs involving the circulatory and respiratory systems: Secondary | ICD-10-CM

## 2015-12-28 HISTORY — DX: Other specified symptoms and signs involving the circulatory and respiratory systems: R09.89

## 2015-12-28 NOTE — Telephone Encounter (Signed)
Left VM Kerrin Champagne to call pt for AAA duplex

## 2016-01-16 ENCOUNTER — Ambulatory Visit (HOSPITAL_COMMUNITY)
Admission: RE | Admit: 2016-01-16 | Discharge: 2016-01-16 | Disposition: A | Discharge status: Home / Self Care | Payer: 59 | Source: Ambulatory Visit | Attending: Cardiovascular Disease | Admitting: Cardiovascular Disease

## 2016-01-16 ENCOUNTER — Other Ambulatory Visit (HOSPITAL_COMMUNITY): Payer: 59

## 2016-01-16 DIAGNOSIS — R0989 Other specified symptoms and signs involving the circulatory and respiratory systems: Secondary | ICD-10-CM | POA: Diagnosis not present

## 2016-01-16 DIAGNOSIS — I10 Essential (primary) hypertension: Secondary | ICD-10-CM | POA: Diagnosis not present

## 2016-01-18 ENCOUNTER — Other Ambulatory Visit: Payer: Self-pay | Admitting: Family Medicine

## 2016-01-23 ENCOUNTER — Encounter: Payer: Self-pay | Admitting: Family Medicine

## 2016-01-30 ENCOUNTER — Telehealth (HOSPITAL_COMMUNITY): Payer: Self-pay | Admitting: *Deleted

## 2016-01-30 NOTE — Telephone Encounter (Signed)
Pt aware of results:  Result Notes   Notes Recorded by Jolaine Artist, MD on 01/22/2016 at 4:28 PM EDT ok

## 2016-04-07 ENCOUNTER — Other Ambulatory Visit: Payer: Self-pay | Admitting: Family Medicine

## 2016-06-26 DIAGNOSIS — M5127 Other intervertebral disc displacement, lumbosacral region: Secondary | ICD-10-CM | POA: Diagnosis not present

## 2016-07-22 ENCOUNTER — Other Ambulatory Visit (HOSPITAL_COMMUNITY): Payer: Self-pay | Admitting: *Deleted

## 2016-07-22 MED ORDER — ROSUVASTATIN CALCIUM 20 MG PO TABS: 20.0000 mg | ORAL_TABLET | Freq: Every day | ORAL | 3 refills | Status: DC

## 2016-07-23 ENCOUNTER — Encounter: Payer: Self-pay | Admitting: Family Medicine

## 2016-07-23 ENCOUNTER — Ambulatory Visit (INDEPENDENT_AMBULATORY_CARE_PROVIDER_SITE_OTHER): Payer: 59 | Admitting: Family Medicine

## 2016-07-23 VITALS — BP 118/76 | HR 100 | Temp 98.1°F | Ht 72.75 in | Wt 228.0 lb

## 2016-07-23 DIAGNOSIS — Z23 Encounter for immunization: Secondary | ICD-10-CM | POA: Diagnosis not present

## 2016-07-23 DIAGNOSIS — Z0000 Encounter for general adult medical examination without abnormal findings: Secondary | ICD-10-CM | POA: Diagnosis not present

## 2016-07-23 LAB — COMPREHENSIVE METABOLIC PANEL
ALBUMIN: 4.8 g/dL (ref 3.5–5.2)
ALT: 46 U/L (ref 0–53)
AST: 33 U/L (ref 0–37)
Alkaline Phosphatase: 66 U/L (ref 39–117)
BUN: 20 mg/dL (ref 6–23)
CHLORIDE: 101 meq/L (ref 96–112)
CO2: 31 meq/L (ref 19–32)
CREATININE: 1.26 mg/dL (ref 0.40–1.50)
Calcium: 9.8 mg/dL (ref 8.4–10.5)
GFR: 63.49 mL/min (ref >60.00)
Glucose, Bld: 107 mg/dL — ABNORMAL HIGH (ref 70–99)
POTASSIUM: 4.4 meq/L (ref 3.5–5.1)
SODIUM: 139 meq/L (ref 135–145)
Total Bilirubin: 1 mg/dL (ref 0.2–1.2)
Total Protein: 7.6 g/dL (ref 6.0–8.3)

## 2016-07-23 LAB — CBC
HEMATOCRIT: 45.5 % (ref 39.0–52.0)
Hemoglobin: 15.6 g/dL (ref 13.0–17.0)
MCHC: 34.2 g/dL (ref 30.0–36.0)
MCV: 88.8 fl (ref 78.0–100.0)
Platelets: 228 10*3/uL (ref 150.0–400.0)
RBC: 5.12 Mil/uL (ref 4.22–5.81)
RDW: 13.2 % (ref 11.5–15.5)
WBC: 5.7 10*3/uL (ref 4.0–10.5)

## 2016-07-23 LAB — LIPID PANEL
CHOL/HDL RATIO: 2
Cholesterol: 138 mg/dL (ref 0–200)
HDL: 56.3 mg/dL (ref >39.00)
LDL CALC: 58 mg/dL (ref 0–99)
NONHDL: 81.83
Triglycerides: 121 mg/dL (ref 0.0–149.0)
VLDL: 24.2 mg/dL (ref 0.0–40.0)

## 2016-07-23 LAB — PSA: PSA: 1.32 ng/mL (ref 0.10–4.00)

## 2016-07-23 LAB — TSH: TSH: 0.19 u[IU]/mL — AB (ref 0.35–4.50)

## 2016-07-23 MED ORDER — ZOLPIDEM TARTRATE 10 MG PO TABS: 10.0000 mg | ORAL_TABLET | Freq: Every evening | ORAL | 2 refills | Status: DC | PRN

## 2016-07-23 NOTE — Patient Instructions (Addendum)
Check with Dr. Ouida Sills next visit- I would like to give you pneumovax 23 and shingrix- as these are not live vaccines if he is ok with it.   Labs before you go  I like the idea of being 10-15 lbs down within a year. Glad your wife is supporting you in this venture.   I sent this message to Dr. Haroldine Laws:  Dr. Haroldine Laws,  In your last visit with patient, you mentioned  "7. Dilated aortic root (4.1 cm) on cardiac CT --check echo in 6 months" I didn't see any mechanism for this to get set up in the chart but could be missing this. Will your office order this or do I need to order this? Thanks, Jared Martin

## 2016-07-23 NOTE — Progress Notes (Signed)
Pre visit review using our clinic review tool, if applicable. No additional management support is needed unless otherwise documented below in the visit note. 

## 2016-07-23 NOTE — Progress Notes (Signed)
Phone: [phone]  Subjective:  Patient presents today for their annual physical. Chief complaint-noted.   See problem oriented charting- ROS- full  review of systems was completed and negative including No chest pain or shortness of breath. No headache or blurry vision. Does have some joint pain but enbrel really helps  The following were reviewed and entered/updated in epic: Past Medical History:  Diagnosis Date  . Arthritis   . Hypertension   . Thyroid cancer    s/p removal, now hypothyroid   Patient Active Problem List   Diagnosis Date Noted  . Immunosuppression ([place]) 02/17/2014    Priority: High  . Hyperlipemia 11/22/2015    Priority: Medium  . GAD (generalized anxiety disorder) 02/17/2014    Priority: Medium  . Psoriatic arthritis ([place]) 02/17/2014    Priority: Medium  . Hypothyroidism 02/17/2014    Priority: Medium  . Insomnia 02/17/2014    Priority: Medium  . Essential hypertension, benign 02/17/2014    Priority: Medium  . Abdominal bruit 12/28/2015    Priority: Low  . Erectile dysfunction 02/17/2014    Priority: Low  . Total knee replacement status 02/17/2014    Priority: Low  . Sciatica of left side 02/17/2014    Priority: Low   Past Surgical History:  Procedure Laterality Date  . CERVICAL DISCECTOMY  2015  . KNEE SURGERY Right 1980-2011  . LUMBAR DISC SURGERY     l4-l5, l5-s1- ruptured disc. Dr. Ellene Route  . right foot Right 2005  . SHOULDER SURGERY Right 1983  . THYROIDECTOMY Bilateral 2000    Family History  Problem Relation Age of Onset  . Cancer Mother   . Hypertension Mother   . Hypertension Father   . Diabetes Sister     Medications- reviewed and updated Current Outpatient Prescriptions  Medication Sig Dispense Refill  . ALPRAZolam (XANAX) 0.25 MG tablet Take 1 tablet (0.25 mg total) by mouth at bedtime as needed for anxiety. 30 tablet 0  . aspirin EC 81 MG tablet Take 1 tablet (81 mg total) by mouth daily. 90 tablet 3  . Calcium  Carbonate-Vitamin D (CALCIUM-VITAMIN D) 500-200 MG-UNIT tablet Take 1 tablet by mouth daily.    . cholecalciferol (VITAMIN D) 1000 units tablet Take 2,000 Units by mouth daily.    . clindamycin (CLEOCIN) 150 MG capsule Take 150 mg by mouth as needed.    . DULoxetine (CYMBALTA) 60 MG capsule TAKE 1 CAPSULE TWICE A DAY 180 capsule 2  . etanercept (ENBREL SURECLICK) 50 MG/ML injection Inject 50 mg into the skin once a week.    . levothyroxine (SYNTHROID, LEVOTHROID) 125 MCG tablet Take 125 mcg by mouth 2 (two) times daily.    [name] lisinopril (PRINIVIL,ZESTRIL) 5 MG tablet TAKE 1 TABLET DAILY 30 tablet 0  . Multiple Vitamin (MULTIVITAMIN) capsule Take 1 capsule by mouth daily.    . rosuvastatin (CRESTOR) 20 MG tablet Take 1 tablet (20 mg total) by mouth daily. 90 tablet 3  . sildenafil (VIAGRA) 50 MG tablet Take 1 tablet (50 mg total) by mouth daily as needed for erectile dysfunction. 10 tablet 5  . zolpidem (AMBIEN) 10 MG tablet Take 1 tablet (10 mg total) by mouth at bedtime as needed for sleep. 30 tablet 2   No current facility-administered medications for this visit.     Allergies-reviewed and updated Allergies  Allergen Reactions  . Penicillins Rash    Social History   Social History  . Marital status: Married    Spouse name: N/A  .  Number of children: N/A  . Years of education: N/A   Social History Main Topics  . Smoking status: Never Smoker  . Smokeless tobacco: Never Used  . Alcohol use 4.0 - 5.0 oz/week    8 - 10 drink(s) per week  . Drug use: No  . Sexual activity: Yes    Partners: Female   Other Topics Concern  . Not on file   Social History Narrative   Married for 29 years in 2016. 3 kids-1 in [place] day school, 1 at [place], 1 at [employer]- injury  Preventing from playing baseball.       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 Oropharynx normal Neck: no thyroid gland- surgically absent CV: RRR no murmurs rubs or gallops Lungs: CTAB no crackles, wheeze, rhonchi Abdomen: soft/nontender/nondistended/normal bowel sounds. No rebound or guarding.  Ext: no edema Skin: warm, dry Neuro: grossly normal, moves all extremities, PERRLA Rectal: normal tone, diffusely enlarged prostate, no masses or tenderness  Assessment/Plan:  56 y.o. male presenting for annual physical.  Health Maintenance counseling: 1. Anticipatory guidance: Patient counseled regarding regular dental exams -q6 months, eye exams - lasik about 10 years ago- goes every 2 years, wearing seatbelts.  2. Risk factor reduction:  Advised patient of need for regular exercise and diet rich and fruits and vegetables to reduce risk of heart attack and stroke. Exercise- 4-5 x a week exercises. Diet-made some improvements and got under 220 then up after the holidays. Wife is really helping right now to improve.  Wt Readings from Last 3 Encounters:  07/23/16 228 lb (103.4 kg)  12/27/15 223 lb 8 oz (101.4 kg)  11/21/15 226 lb 12 oz (102.9 kg)  3. Immunizations/screenings/ancillary studies Immunization History  Administered Date(s) Administered  . Influenza,inj,Quad PF,36+ Mos 07/23/2016  . Tdap 02/17/2014  4. Prostate cancer screening- low risk rectal exam but does have some BPH but minimal symptoms, also get psa  Lab Results  Component Value Date   PSA 1.59 02/15/2015   5. Colon cancer screening - 2015 with 10 year repeat.  6. Skin cancer screening- [place] [facility]  Status of chronic or acute concerns  Psoriatic arthritis follows with Dr. Ouida Sills on enbrel  Insomnia- intermittent ambien use mainly with travel- lost weight and was exercising more and really minimal  issues  Hypothyroidism- follows with [facility] endocrinologist. Has been stable on levothyroxine 138mcg  GAD- on cymbalta 120mg  daily and xanax - goes through 30 in about a year. Has some left. Never mixes ambien and xanax   HTN controlled on lisinopril 5mg   HLD- started on crestor 20mg  by Dr. Haroldine Laws. Will update lipids  Dilated aortic root on cardiac CT- will get echo in 6 months. Had calcium score of 0 with cardiology Dr. Haroldine Laws  ED- viagra prn  Sent message to Dr. Haroldine Laws to follow up on  " 7. Dilated aortic root (4.1 cm) on cardiac CT --check echo in 6 months "   No problem-specific Assessment & Plan notes found for this encounter.   Return in about  1 year (around 07/23/2017) for physical.  Orders Placed This Encounter  Procedures  . Flu Vaccine QUAD 36+ mos IM  . CBC    [place]  . Comprehensive metabolic panel    [place]    Order Specific Question:   Has the patient fasted?    Answer:   No  . TSH    [place]  . Lipid panel    [place]    Order Specific Question:   Has the patient fasted?    Answer:   No  . PSA    Meds ordered this encounter  Medications  . zolpidem (AMBIEN) 10 MG tablet    Sig: Take 1 tablet (10 mg total) by mouth at bedtime as needed for sleep.    Dispense:  30 tablet    Refill:  2   Return precautions advised.   Garret Reddish, MD

## 2016-07-24 ENCOUNTER — Other Ambulatory Visit: Payer: Self-pay

## 2016-07-24 ENCOUNTER — Encounter: Payer: Self-pay | Admitting: Family Medicine

## 2016-07-24 MED ORDER — DULOXETINE HCL 60 MG PO CPEP: 60.0000 mg | ORAL_CAPSULE | Freq: Two times a day (BID) | ORAL | 2 refills | Status: DC

## 2016-08-12 IMAGING — CT CT HEART SCORING
2 series · 16 of 20 positions shown, 18 images · non-contrast
Comparison: No priors.

CLINICAL DATA: Risk stratification

EXAM:
Coronary Calcium Score
TECHNIQUE: The patient was scanned on a Siemens Somatom 64 slice scanner. Axial
non-contrast 3mm slices were carried out through the heart. The data
set was analyzed on a dedicated work station and scored using the
Agatson method.

[Series 2: casc 3.0 i36f 2 bestdiast 71 % · axial · 0.41mm/px · z∈[-125,-32]mm · 8 of 41 slices shown, 10 images]
[im 5/41  vessel]
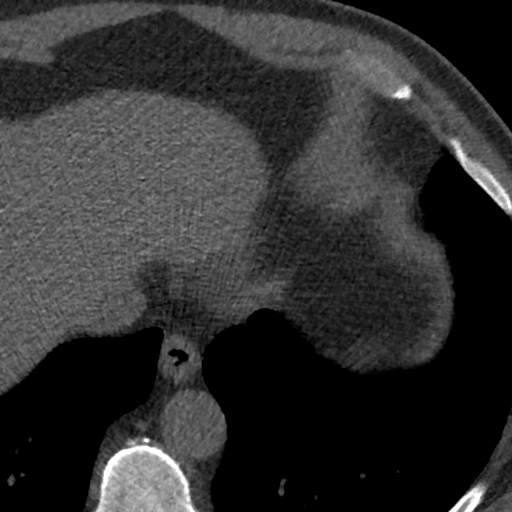
[im 5/41  lung]
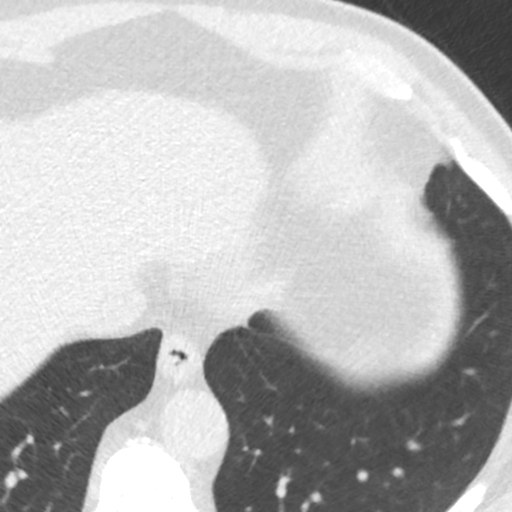
[im 9/41  vessel]
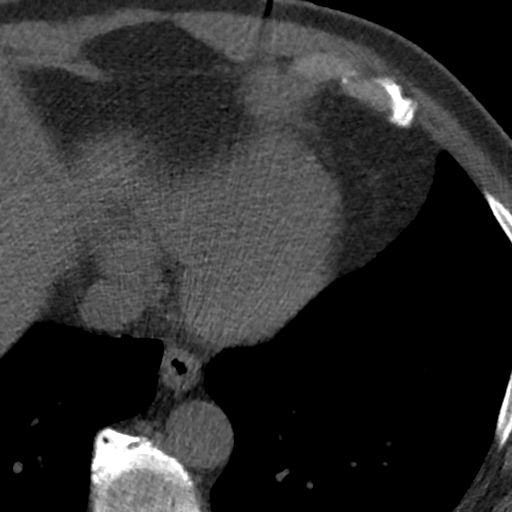
[im 14/41  vessel]
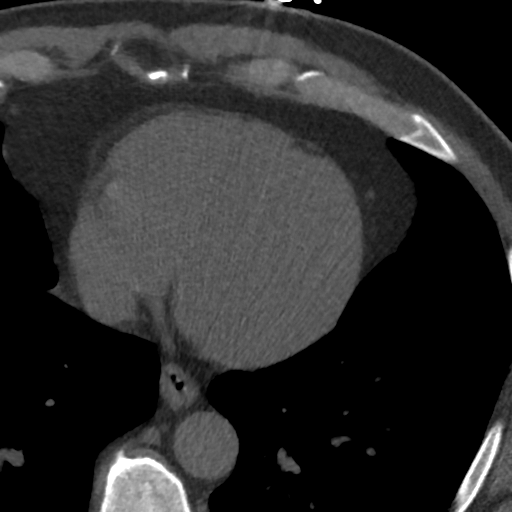
[im 18/41  vessel]
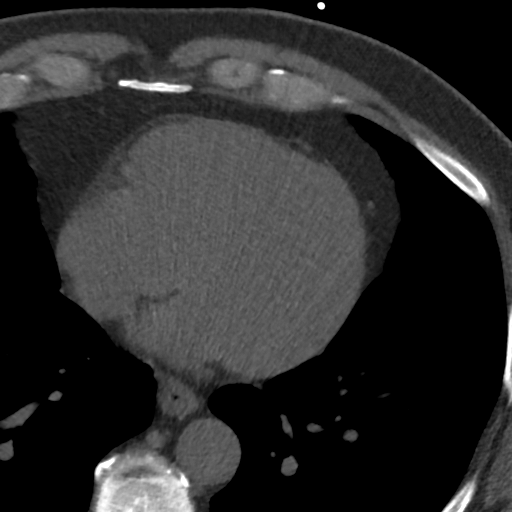
[im 23/41  vessel]
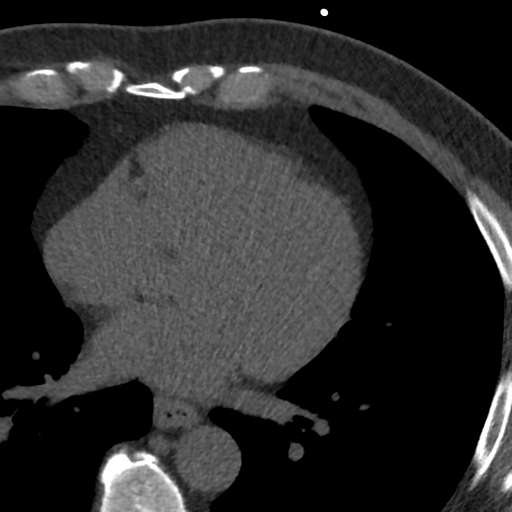
[im 23/41  lung]
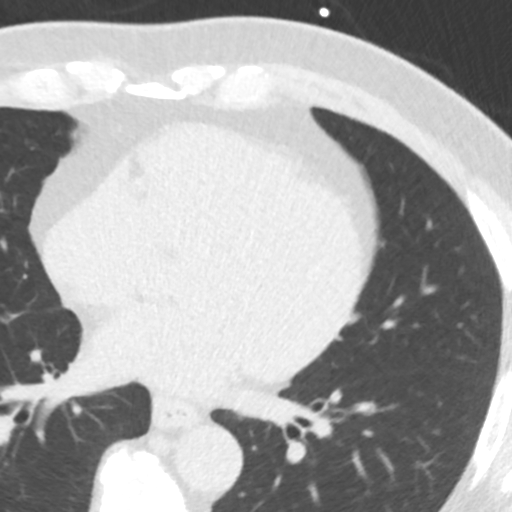
[im 27/41  vessel]
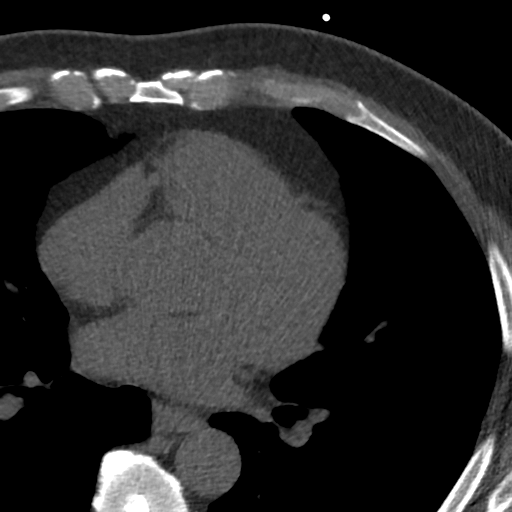
[im 32/41  vessel]
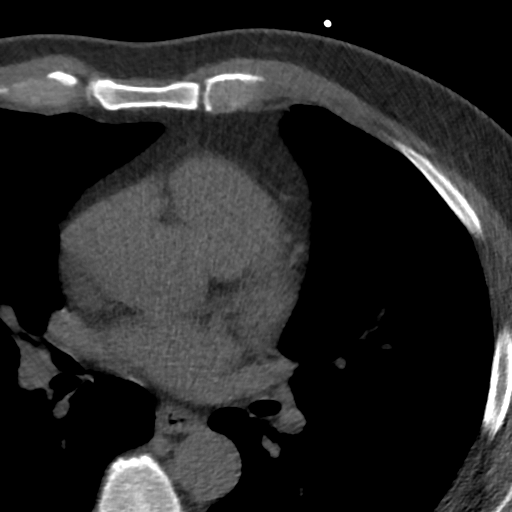
[im 36/41  vessel]
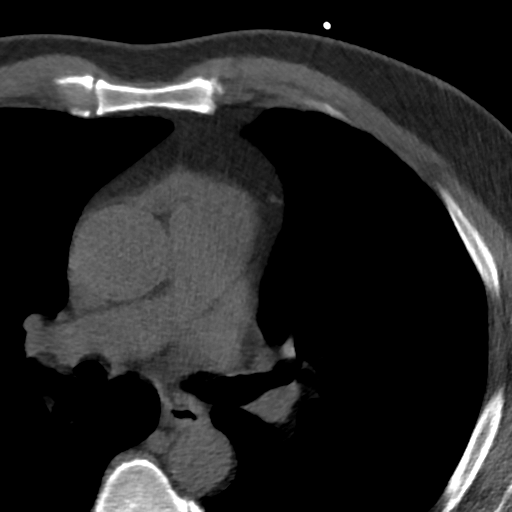

[Series 4: lung st 68 % · axial · 0.71mm/px · z∈[-124,-32]mm · 8 of 41 slices shown]
[im 5/41  lung]
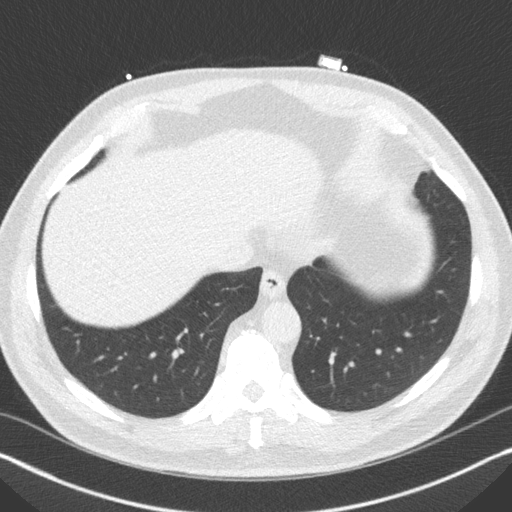
[im 9/41  lung]
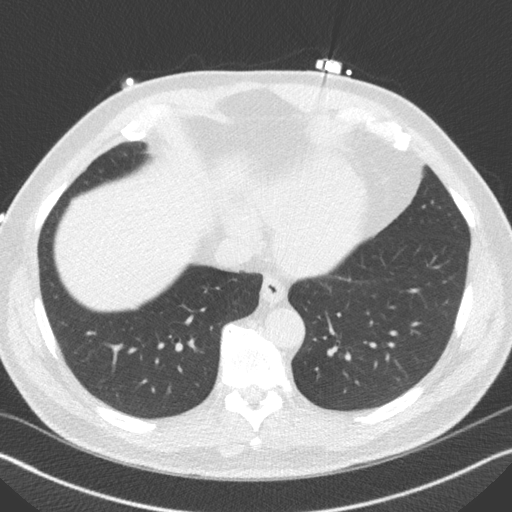
[im 14/41  lung]
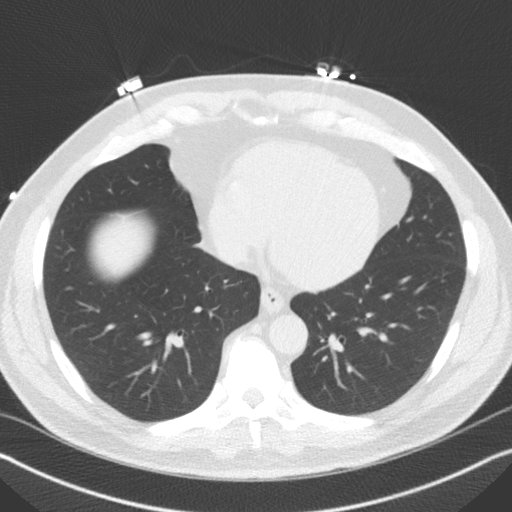
[im 18/41  lung]
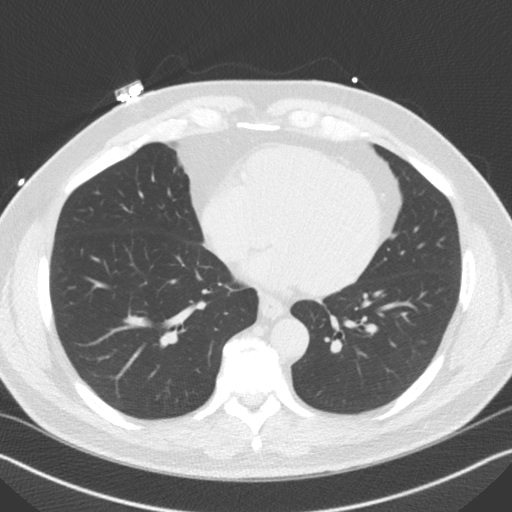
[im 23/41  lung]
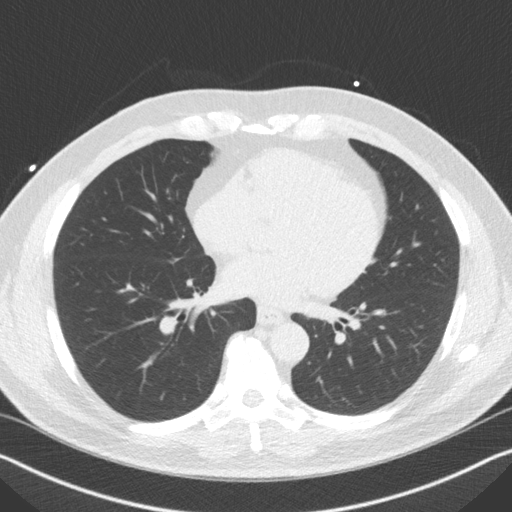
[im 27/41  lung]
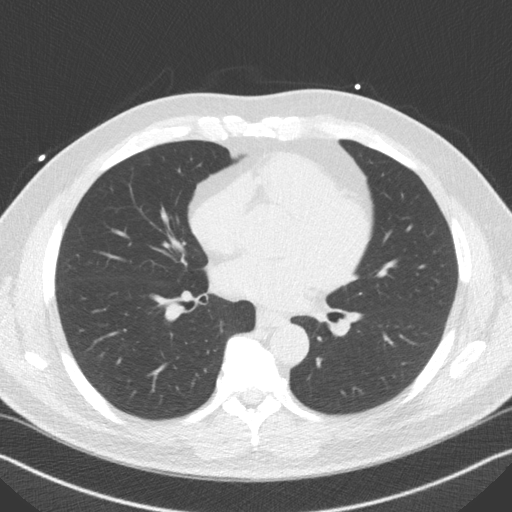
[im 32/41  lung]
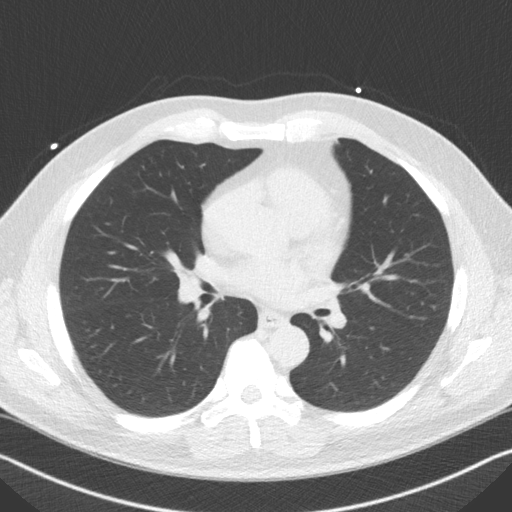
[im 36/41  lung]
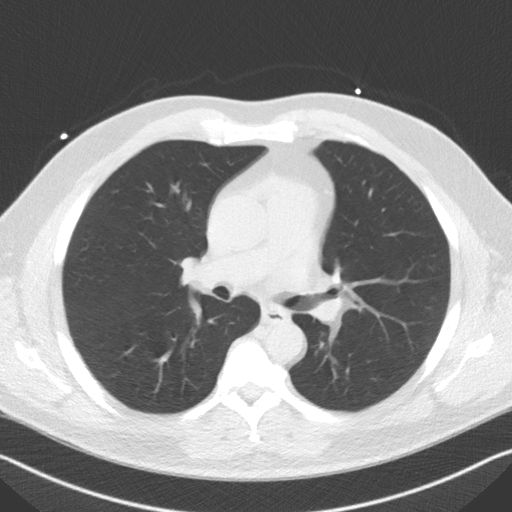

[16 of 20 positions shown; findings below may reference images not displayed]

FINDINGS: Non-cardiac: No significant non cardiac findings on limited lung and
soft tissue windows. See separate report from [REDACTED].

Ascending Aorta:  4.1 cm

Pericardium: Normal

Coronary arteries: Small plaque in mid LAD but did not meet density
criteria to be scored
IMPRESSION: Coronary calcium score of 0.  Mild aortic root dilatation

Medhakim Grain

EXAM:
OVER-READ INTERPRETATION  CT CHEST

The following report is an over-read performed by radiologist Dr.
over-read does not include interpretation of cardiac or coronary
anatomy or pathology. The coronary calcium score interpretation by
the cardiologist is attached.
FINDINGS: Within the visualized portions of the thorax there are no suspicious
appearing pulmonary nodules or masses, there is no acute
consolidative airspace disease, no pleural effusions, no
pneumothorax and no lymphadenopathy. Visualized portions of the
upper abdomen are unremarkable. There are no aggressive appearing
lytic or blastic lesions noted in the visualized portions of the
skeleton.
IMPRESSION: 1. No significant incidental noncardiac findings are noted.

## 2016-08-19 DIAGNOSIS — L409 Psoriasis, unspecified: Secondary | ICD-10-CM | POA: Diagnosis not present

## 2016-08-19 DIAGNOSIS — L4059 Other psoriatic arthropathy: Secondary | ICD-10-CM | POA: Diagnosis not present

## 2016-09-25 DIAGNOSIS — E890 Postprocedural hypothyroidism: Secondary | ICD-10-CM | POA: Diagnosis not present

## 2016-09-25 DIAGNOSIS — E559 Vitamin D deficiency, unspecified: Secondary | ICD-10-CM | POA: Diagnosis not present

## 2016-09-25 DIAGNOSIS — C73 Malignant neoplasm of thyroid gland: Secondary | ICD-10-CM | POA: Diagnosis not present

## 2016-10-02 ENCOUNTER — Telehealth: Payer: Self-pay | Admitting: Family Medicine

## 2016-10-02 ENCOUNTER — Other Ambulatory Visit: Payer: Self-pay

## 2016-10-02 MED ORDER — ALPRAZOLAM 0.25 MG PO TABS: 0.2500 mg | ORAL_TABLET | Freq: Every evening | ORAL | 0 refills | Status: DC | PRN

## 2016-10-02 MED ORDER — SILDENAFIL CITRATE 50 MG PO TABS: 50.0000 mg | ORAL_TABLET | Freq: Every day | ORAL | 5 refills | Status: DC | PRN

## 2016-10-02 MED ORDER — ZOLPIDEM TARTRATE 10 MG PO TABS: 10.0000 mg | ORAL_TABLET | Freq: Every evening | ORAL | 2 refills | Status: DC | PRN

## 2016-10-02 NOTE — Telephone Encounter (Signed)
[facility] and needs his meds sent in to new pharmacy. Pt request refill   ALPRAZolam (XANAX) 0.25 MG tablet zolpidem (AMBIEN) 10 MG tablet sildenafil (VIAGRA) 50 MG tablet  [facility]

## 2016-10-02 NOTE — Telephone Encounter (Signed)
Prescriptions sent to pharmacy as requested 

## 2016-10-29 DIAGNOSIS — D2261 Melanocytic nevi of right upper limb, including shoulder: Secondary | ICD-10-CM | POA: Diagnosis not present

## 2016-10-29 DIAGNOSIS — L738 Other specified follicular disorders: Secondary | ICD-10-CM | POA: Diagnosis not present

## 2016-10-29 DIAGNOSIS — L400 Psoriasis vulgaris: Secondary | ICD-10-CM | POA: Diagnosis not present

## 2016-11-12 DIAGNOSIS — E890 Postprocedural hypothyroidism: Secondary | ICD-10-CM | POA: Diagnosis not present

## 2017-01-12 ENCOUNTER — Other Ambulatory Visit: Payer: Self-pay

## 2017-01-12 ENCOUNTER — Telehealth: Payer: Self-pay | Admitting: Family Medicine

## 2017-01-12 MED ORDER — LISINOPRIL 5 MG PO TABS: 5.0000 mg | ORAL_TABLET | Freq: Every day | ORAL | 1 refills | Status: DC

## 2017-01-12 NOTE — Telephone Encounter (Signed)
Pt need new Rx for lisinopril  [facility]  [facility] [address] [facility] [address]  Pt is out of medication.

## 2017-01-12 NOTE — Telephone Encounter (Signed)
Prescription sent to pharmacy as requested.

## 2017-02-16 DIAGNOSIS — M79672 Pain in left foot: Secondary | ICD-10-CM | POA: Diagnosis not present

## 2017-02-16 DIAGNOSIS — L409 Psoriasis, unspecified: Secondary | ICD-10-CM | POA: Diagnosis not present

## 2017-02-16 DIAGNOSIS — L4059 Other psoriatic arthropathy: Secondary | ICD-10-CM | POA: Diagnosis not present

## 2017-04-07 ENCOUNTER — Telehealth: Payer: Self-pay | Admitting: Family Medicine

## 2017-04-07 ENCOUNTER — Other Ambulatory Visit: Payer: Self-pay

## 2017-04-07 MED ORDER — ALPRAZOLAM 0.25 MG PO TABS: 0.2500 mg | ORAL_TABLET | Freq: Every evening | ORAL | 0 refills | Status: DC | PRN

## 2017-04-07 MED ORDER — ZOLPIDEM TARTRATE 10 MG PO TABS: 10.0000 mg | ORAL_TABLET | Freq: Every evening | ORAL | 2 refills | Status: DC | PRN

## 2017-04-07 NOTE — Telephone Encounter (Signed)
Prescriptions sent to pharmacy as requested 

## 2017-04-07 NOTE — Telephone Encounter (Signed)
MEDICATION:   zolpidem (AMBIEN) 10 MG tablet  ALPRAZolam (XANAX) 0.25 MG tablet   PHARMACY:   [id number] [place], [place] - [address] AT [address] [place] [phone] (Phone) [phone] (Fax)     IS THIS A 90 DAY SUPPLY : yes  IS PATIENT OUT OF MEDICATION: no  IF NOT; HOW MUCH IS LEFT: out very soon, traveling at the end of the month  LAST APPOINTMENT DATE: @1 /31/18  NEXT APPOINTMENT DATE:@Visit  date not found  OTHER COMMENTS:    **Let patient know to contact pharmacy at the end of the day to make sure medication is ready. **  ** Please notify patient to allow 48-72 hours to process**  **Encourage patient to contact the pharmacy for refills or they can request refills through [facility]**

## 2017-06-10 ENCOUNTER — Telehealth: Payer: Self-pay | Admitting: Family Medicine

## 2017-06-10 NOTE — Telephone Encounter (Signed)
MEDICATION: lisinopril (PRINIVIL,ZESTRIL) 5 MG tablet  PHARMACY:   [id number] [place], [place] - [address] AT [address] [phone] (Phone) [phone] (Fax)     IS THIS A 90 DAY SUPPLY : Y  IS PATIENT OUT OF MEDICATION:   IF NOT; HOW MUCH IS LEFT:   LAST APPOINTMENT DATE: @01 /31/18  NEXT APPOINTMENT DATE:@Visit  date not found  OTHER COMMENTS:    **Let patient know to contact pharmacy at the end of the day to make sure medication is ready. **  ** Please notify patient to allow 48-72 hours to process**  **Encourage patient to contact the pharmacy for refills or they can request refills through [facility]**

## 2017-06-11 ENCOUNTER — Other Ambulatory Visit: Payer: Self-pay

## 2017-06-11 MED ORDER — LISINOPRIL 5 MG PO TABS: 5.0000 mg | ORAL_TABLET | Freq: Every day | ORAL | 1 refills | Status: DC

## 2017-06-11 NOTE — Telephone Encounter (Signed)
Prescription sent to pharmacy as requested.

## 2017-06-14 ENCOUNTER — Other Ambulatory Visit: Payer: Self-pay | Admitting: Family Medicine

## 2017-08-19 DIAGNOSIS — L4059 Other psoriatic arthropathy: Secondary | ICD-10-CM | POA: Diagnosis not present

## 2017-08-19 DIAGNOSIS — L409 Psoriasis, unspecified: Secondary | ICD-10-CM | POA: Diagnosis not present

## 2017-08-19 LAB — BASIC METABOLIC PANEL
BUN: 19 (ref 4–21)
Creatinine: 1.4 — AB (ref 0.6–1.3)
Glucose: 106
POTASSIUM: 4.2 (ref 3.4–5.3)
SODIUM: 143 (ref 137–147)

## 2017-08-19 LAB — CBC AND DIFFERENTIAL
HEMATOCRIT: 43 (ref 41–53)
HEMOGLOBIN: 14.5 (ref 13.5–17.5)
Neutrophils Absolute: 4
Platelets: 232 (ref 150–399)
WBC: 6.2

## 2017-08-19 LAB — HEPATIC FUNCTION PANEL
ALT: 43 — AB (ref 10–40)
AST: 31 (ref 14–40)
Alkaline Phosphatase: 70 (ref 25–125)
Bilirubin, Total: 0.5

## 2017-08-26 ENCOUNTER — Encounter: Payer: Self-pay | Admitting: Family Medicine

## 2017-09-12 ENCOUNTER — Other Ambulatory Visit (HOSPITAL_COMMUNITY): Payer: Self-pay | Admitting: Internal Medicine

## 2017-09-18 ENCOUNTER — Other Ambulatory Visit (HOSPITAL_COMMUNITY): Payer: Self-pay | Admitting: *Deleted

## 2017-09-18 MED ORDER — ROSUVASTATIN CALCIUM 20 MG PO TABS: 20.0000 mg | ORAL_TABLET | Freq: Every day | ORAL | 3 refills | Status: DC

## 2017-09-22 ENCOUNTER — Other Ambulatory Visit (HOSPITAL_COMMUNITY): Payer: Self-pay | Admitting: *Deleted

## 2017-09-22 MED ORDER — ROSUVASTATIN CALCIUM 20 MG PO TABS: 20.0000 mg | ORAL_TABLET | Freq: Every day | ORAL | 0 refills | Status: DC

## 2017-10-02 ENCOUNTER — Telehealth: Payer: Self-pay | Admitting: Family Medicine

## 2017-10-02 NOTE — Telephone Encounter (Signed)
See note

## 2017-10-02 NOTE — Telephone Encounter (Signed)
Called patient and left a voicemail message asking patient to call and schedule an appointment as he has not been seen in over a year.

## 2017-10-02 NOTE — Telephone Encounter (Signed)
Copied from E. [place] [id number]. Topic: Quick Communication - Rx Refill/Question >> Oct 02, 2017  2:07 PM Lennox Solders wrote: Medication: alprazolam Has the patient contacted their pharmacy yes. [facility] [address] Pt is out

## 2017-10-08 ENCOUNTER — Ambulatory Visit (INDEPENDENT_AMBULATORY_CARE_PROVIDER_SITE_OTHER): Payer: 59 | Admitting: Family Medicine

## 2017-10-08 ENCOUNTER — Encounter: Payer: Self-pay | Admitting: Family Medicine

## 2017-10-08 VITALS — BP 122/80 | HR 95 | Temp 98.6°F | Ht 72.75 in | Wt 232.6 lb

## 2017-10-08 DIAGNOSIS — D849 Immunodeficiency, unspecified: Secondary | ICD-10-CM

## 2017-10-08 DIAGNOSIS — I77810 Thoracic aortic ectasia: Secondary | ICD-10-CM | POA: Diagnosis not present

## 2017-10-08 DIAGNOSIS — E039 Hypothyroidism, unspecified: Secondary | ICD-10-CM

## 2017-10-08 DIAGNOSIS — F5101 Primary insomnia: Secondary | ICD-10-CM | POA: Diagnosis not present

## 2017-10-08 DIAGNOSIS — D899 Disorder involving the immune mechanism, unspecified: Secondary | ICD-10-CM | POA: Diagnosis not present

## 2017-10-08 DIAGNOSIS — E785 Hyperlipidemia, unspecified: Secondary | ICD-10-CM

## 2017-10-08 DIAGNOSIS — Z0000 Encounter for general adult medical examination without abnormal findings: Secondary | ICD-10-CM | POA: Diagnosis not present

## 2017-10-08 DIAGNOSIS — F411 Generalized anxiety disorder: Secondary | ICD-10-CM

## 2017-10-08 DIAGNOSIS — I10 Essential (primary) hypertension: Secondary | ICD-10-CM | POA: Diagnosis not present

## 2017-10-08 DIAGNOSIS — L4050 Arthropathic psoriasis, unspecified: Secondary | ICD-10-CM | POA: Diagnosis not present

## 2017-10-08 MED ORDER — DULOXETINE HCL 60 MG PO CPEP: 60.0000 mg | ORAL_CAPSULE | Freq: Two times a day (BID) | ORAL | 2 refills | Status: DC

## 2017-10-08 MED ORDER — LISINOPRIL 5 MG PO TABS: 5.0000 mg | ORAL_TABLET | Freq: Every day | ORAL | 3 refills | Status: DC

## 2017-10-08 MED ORDER — ROSUVASTATIN CALCIUM 20 MG PO TABS: 20.0000 mg | ORAL_TABLET | Freq: Every day | ORAL | 3 refills | Status: DC

## 2017-10-08 MED ORDER — ZOLPIDEM TARTRATE 10 MG PO TABS: 10.0000 mg | ORAL_TABLET | Freq: Every evening | ORAL | 2 refills | Status: DC | PRN

## 2017-10-08 MED ORDER — SILDENAFIL CITRATE 50 MG PO TABS: 50.0000 mg | ORAL_TABLET | Freq: Every day | ORAL | 10 refills | Status: DC | PRN

## 2017-10-08 MED ORDER — ALPRAZOLAM 0.25 MG PO TABS: 0.2500 mg | ORAL_TABLET | Freq: Every evening | ORAL | 0 refills | Status: DC | PRN

## 2017-10-08 MED ORDER — LEVOTHYROXINE SODIUM 125 MCG PO TABS: 125.0000 ug | ORAL_TABLET | Freq: Two times a day (BID) | ORAL | 3 refills | Status: DC

## 2017-10-08 NOTE — Patient Instructions (Addendum)
Schedule a lab visit at the check out desk within 2 weeks. Return for future fasting labs meaning nothing but water after midnight please. Ok to take your medications with water.   No changes today  I refilled ambien, levothyroxine,  and xanax. Our team will refill all other meds that I prescribe as well as your cholesterol medicine 90 days worth with 3 refills
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
## 2017-10-08 NOTE — Telephone Encounter (Signed)
Patient was seen in the office today

## 2017-10-09 DIAGNOSIS — I77810 Thoracic aortic ectasia: Secondary | ICD-10-CM | POA: Insufficient documentation

## 2017-10-09 NOTE — Assessment & Plan Note (Signed)
HLD- started on crestor 20mg  by Dr. Haroldine Laws. Will update lipids

## 2017-10-09 NOTE — Assessment & Plan Note (Signed)
GAD- very mild poor control with GAD7 of 6- high stress with work changes.  on cymbalta 120mg  daily and xanax. - goes through 60 in about a year.  Never mixes ambien and xanax - separates by 6 hours.

## 2017-10-09 NOTE — Assessment & Plan Note (Addendum)
Psoriatic arthritis follows with Dr. Ouida Sills on enbrel-immunosuppressed status noted.  He is doing well

## 2017-10-09 NOTE — Assessment & Plan Note (Signed)
Hypothyroidism- followed with [facility] endocrinologist- would be easier to follow here now. Has been stable on levothyroxine 120mcg BID- we will get TSH with labs then take over rx. Apparently was having issues regulating on once daily but worked on BID

## 2017-10-09 NOTE — Assessment & Plan Note (Signed)
HTN controlled on lisinopril 5mg 

## 2017-10-09 NOTE — Assessment & Plan Note (Signed)
Insomnia- intermittent ambien use mainly with travel- overseas trips mostly

## 2017-10-09 NOTE — Assessment & Plan Note (Signed)
May 10th sees Dr. Haroldine Laws- he will check in with him about dilated aortic root. He wants to follow up here only in future if possible- he will discuss with Dr. Haroldine Laws. Of note,  Had calcium score of 0 with cardiology Dr. Haroldine Laws a few years ago

## 2017-10-21 ENCOUNTER — Other Ambulatory Visit (INDEPENDENT_AMBULATORY_CARE_PROVIDER_SITE_OTHER): Payer: 59

## 2017-10-21 DIAGNOSIS — E039 Hypothyroidism, unspecified: Secondary | ICD-10-CM

## 2017-10-21 DIAGNOSIS — E785 Hyperlipidemia, unspecified: Secondary | ICD-10-CM

## 2017-10-21 DIAGNOSIS — Z0000 Encounter for general adult medical examination without abnormal findings: Secondary | ICD-10-CM | POA: Diagnosis not present

## 2017-10-21 LAB — CBC
HCT: 45.1 % (ref 39.0–52.0)
Hemoglobin: 15.7 g/dL (ref 13.0–17.0)
MCHC: 34.8 g/dL (ref 30.0–36.0)
MCV: 87.7 fl (ref 78.0–100.0)
Platelets: 278 10*3/uL (ref 150.0–400.0)
RBC: 5.14 Mil/uL (ref 4.22–5.81)
RDW: 12.7 % (ref 11.5–15.5)
WBC: 4.6 10*3/uL (ref 4.0–10.5)

## 2017-10-21 LAB — COMPREHENSIVE METABOLIC PANEL
ALK PHOS: 58 U/L (ref 39–117)
ALT: 32 U/L (ref 0–53)
AST: 26 U/L (ref 0–37)
Albumin: 4.8 g/dL (ref 3.5–5.2)
BUN: 17 mg/dL (ref 6–23)
CO2: 29 meq/L (ref 19–32)
CREATININE: 1.28 mg/dL (ref 0.40–1.50)
Calcium: 9.8 mg/dL (ref 8.4–10.5)
Chloride: 100 mEq/L (ref 96–112)
GFR: 62.05 mL/min (ref >60.00)
GLUCOSE: 106 mg/dL — AB (ref 70–99)
Potassium: 4.5 mEq/L (ref 3.5–5.1)
Sodium: 138 mEq/L (ref 135–145)
TOTAL PROTEIN: 7.8 g/dL (ref 6.0–8.3)
Total Bilirubin: 0.9 mg/dL (ref 0.2–1.2)

## 2017-10-21 LAB — LIPID PANEL
CHOL/HDL RATIO: 2
Cholesterol: 126 mg/dL (ref 0–200)
HDL: 51.8 mg/dL (ref >39.00)
LDL Cholesterol: 45 mg/dL (ref 0–99)
NONHDL: 73.79
TRIGLYCERIDES: 142 mg/dL (ref 0.0–149.0)
VLDL: 28.4 mg/dL (ref 0.0–40.0)

## 2017-10-21 LAB — TSH: TSH: 0.46 u[IU]/mL (ref 0.35–4.50)

## 2017-10-21 NOTE — Progress Notes (Signed)
Your CBC was normal (blood counts, infection fighting cells, platelets). Your CMET was normal (kidney, liver, and electrolytes, blood sugar) other than blood sugar. At risk for diabetes with blood sugar 106 (at risk from 100-125). Healthy eating, regular exercise, weight loss advised. This risk is stable from last year - at least it is not increasing.  Your cholesterol looks great Your thyroid was normal.

## 2017-10-28 ENCOUNTER — Telehealth: Payer: Self-pay

## 2017-10-28 NOTE — Telephone Encounter (Signed)
Called patient and left a message to call office back.  

## 2017-10-30 ENCOUNTER — Other Ambulatory Visit: Payer: Self-pay

## 2017-10-30 ENCOUNTER — Ambulatory Visit (HOSPITAL_COMMUNITY)
Admission: RE | Admit: 2017-10-30 | Discharge: 2017-10-30 | Disposition: A | Discharge status: Home / Self Care | Payer: 59 | Source: Ambulatory Visit | Attending: Internal Medicine | Admitting: Internal Medicine

## 2017-10-30 ENCOUNTER — Encounter (HOSPITAL_COMMUNITY): Payer: Self-pay | Admitting: Internal Medicine

## 2017-10-30 VITALS — BP 133/69 | HR 90 | Wt 227.8 lb

## 2017-10-30 DIAGNOSIS — N529 Male erectile dysfunction, unspecified: Secondary | ICD-10-CM | POA: Diagnosis not present

## 2017-10-30 DIAGNOSIS — I10 Essential (primary) hypertension: Secondary | ICD-10-CM | POA: Insufficient documentation

## 2017-10-30 DIAGNOSIS — Z923 Personal history of irradiation: Secondary | ICD-10-CM | POA: Diagnosis not present

## 2017-10-30 DIAGNOSIS — Z7189 Other specified counseling: Secondary | ICD-10-CM | POA: Diagnosis not present

## 2017-10-30 DIAGNOSIS — Z79899 Other long term (current) drug therapy: Secondary | ICD-10-CM | POA: Insufficient documentation

## 2017-10-30 DIAGNOSIS — I77810 Thoracic aortic ectasia: Secondary | ICD-10-CM | POA: Diagnosis not present

## 2017-10-30 DIAGNOSIS — R0683 Snoring: Secondary | ICD-10-CM | POA: Diagnosis not present

## 2017-10-30 DIAGNOSIS — Z85850 Personal history of malignant neoplasm of thyroid: Secondary | ICD-10-CM | POA: Diagnosis not present

## 2017-10-30 DIAGNOSIS — E782 Mixed hyperlipidemia: Secondary | ICD-10-CM

## 2017-10-30 DIAGNOSIS — L4050 Arthropathic psoriasis, unspecified: Secondary | ICD-10-CM | POA: Diagnosis not present

## 2017-10-30 DIAGNOSIS — Z833 Family history of diabetes mellitus: Secondary | ICD-10-CM | POA: Diagnosis not present

## 2017-10-30 DIAGNOSIS — E785 Hyperlipidemia, unspecified: Secondary | ICD-10-CM | POA: Insufficient documentation

## 2017-10-30 DIAGNOSIS — Z79890 Hormone replacement therapy (postmenopausal): Secondary | ICD-10-CM | POA: Diagnosis not present

## 2017-10-30 DIAGNOSIS — Z8249 Family history of ischemic heart disease and other diseases of the circulatory system: Secondary | ICD-10-CM | POA: Insufficient documentation

## 2017-10-30 DIAGNOSIS — E8881 Metabolic syndrome: Secondary | ICD-10-CM | POA: Diagnosis not present

## 2017-10-30 DIAGNOSIS — Z7982 Long term (current) use of aspirin: Secondary | ICD-10-CM | POA: Insufficient documentation

## 2017-10-30 DIAGNOSIS — Z880 Allergy status to penicillin: Secondary | ICD-10-CM | POA: Diagnosis not present

## 2017-10-30 DIAGNOSIS — E890 Postprocedural hypothyroidism: Secondary | ICD-10-CM | POA: Diagnosis not present

## 2017-10-30 NOTE — Progress Notes (Signed)
Patient ID: Jared Martin, male   DOB: [birth year], 57 y.o.   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    Father with with ruptured AAA at 17 y/o.   Calcium scoring and stress test ok in 2017 (see below). Did have some soft plaque on calcium scoring CT.  He presents here for follow up: Feeling great overall. Has been very mindful of diet and exercise since a coworker had an MI in 2017. Working out 4x a week. Gets 10-12k steps every day. Tolerating statin and ASA. Denies CP or SOB. Taking all medications as directed. Continues to work full time. Daughter just finished her freshman year at [employer]. BP has been well controlled. Says he snores a lot.   Last lipids (8/16) - didn't want to start statin yet. Wanted to try weight loss.  TC 243 TG 279 HDL 51 LDL 152  Calcium scoring 11/2015 FINDINGS: Non-cardiac: No significant non cardiac findings on limited lung and soft tissue windows. See separate report from [facility]. Ascending Aorta: 4.1 cm Pericardium: Normal Coronary arteries: Small plaque in mid LAD but did not meet density criteria to be scored IMPRESSION: Coronary calcium score of 0. Mild aortic root dilatation  ETT 6/17   The patient walked on a standard Bruce protocol for 9 minutes.  He acheived a peak HR of 148 which is 88% predicted maximal HR  His BP response to exercise was hypertensive  There were no ST or T wave changes to suggest ischemia  Negative GXT. Hypertensive response to exercise.  Review of systems complete and found to be negative unless listed in HPI.    Past Medical History:  Diagnosis Date  . Arthritis   . Hypertension   . Thyroid cancer ([place])    s/p removal, now hypothyroid    Current  Outpatient Medications  Medication Sig Dispense Refill  . ALPRAZolam (XANAX) 0.25 MG tablet Take 1 tablet (0.25 mg total) by mouth at bedtime as needed for anxiety. 30 tablet 0  . aspirin EC 81 MG tablet Take 1 tablet (81 mg total) by mouth daily. 90 tablet 3  . Calcium Carbonate-Vitamin D (CALCIUM-VITAMIN D) 500-200 MG-UNIT tablet Take 1 tablet by mouth daily.    . cholecalciferol (VITAMIN D) 1000 units tablet Take 2,000 Units by mouth daily.    . clindamycin (CLEOCIN) 150 MG capsule Take 150 mg by mouth as needed.    . DULoxetine (CYMBALTA) 60 MG capsule Take 1 capsule (60 mg total) by mouth 2 (two) times daily. 180 capsule 2  . etanercept (ENBREL SURECLICK) 50 MG/ML injection Inject 50 mg into the skin once a week.    . levothyroxine (SYNTHROID, LEVOTHROID) 125 MCG tablet Take 1 tablet (125 mcg total) by mouth 2 (two) times daily. 180 tablet 3  . lisinopril (PRINIVIL,ZESTRIL) 5 MG tablet Take 1 tablet (5 mg total) by mouth daily. 90 tablet 3  . Multiple Vitamin (MULTIVITAMIN) capsule Take 1 capsule by mouth daily.    . rosuvastatin (CRESTOR) 20 MG tablet Take 1 tablet (20 mg total) by mouth daily. 90 tablet 3  . sildenafil (VIAGRA) 50 MG tablet Take 1 tablet (50 mg total) by mouth daily as needed for erectile dysfunction. 10 tablet 10  .  zolpidem (AMBIEN) 10 MG tablet Take 1 tablet (10 mg total) by mouth at bedtime as needed for sleep. 30 tablet 2   No current facility-administered medications for this encounter.     Allergies  Allergen Reactions  . Penicillins Rash      Social History   Socioeconomic History  . Marital status: Married    Spouse name: Not on file  . Number of children: Not on file  . Years of education: Not on file  . Highest education level: Not on file  Occupational History  . Not on file  Social Needs  . Financial resource strain: Not on file  . Food insecurity:    Worry: Not on file    Inability: Not on file  . Transportation needs:    Medical: Not on  file    Non-medical: Not on file  Tobacco Use  . Smoking status: Never Smoker  . Smokeless tobacco: Never Used  Substance and Sexual Activity  . Alcohol use: Yes    Alcohol/week: 4.0 - 5.0 oz    Types: 8 - 10 drink(s) per week  . Drug use: No  . Sexual activity: Yes    Partners: Female  Lifestyle  . Physical activity:    Days per week: Not on file    Minutes per session: Not on file  . Stress: Not on file  Relationships  . Social connections:    Talks on phone: Not on file    Gets together: Not on file    Attends religious service: Not on file    Active member of club or organization: Not on file    Attends meetings of clubs or organizations: Not on file    Relationship status: Not on file  . Intimate partner violence:    Fear of current or ex partner: Not on file    Emotionally abused: Not on file    Physically abused: Not on file    Forced sexual activity: Not on file  Other Topics Concern  . Not on file  Social History Narrative   Married for 29 years in 2016. 3 kids-1 at [place], 1 at [place]--> now at [employer], 1 at [employer]- injury  Preventing from playing baseball.       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 Cancer Mother   . Hypertension Mother   . Hypertension Father   . Diabetes Sister   Father with ruptured AAA in 42s  Vitals:   10/30/17 1357  BP: 133/69  Pulse: 90  SpO2: 100%  Weight: 227 lb 12 oz (103.3 kg)   Wt Readings from Last 3 Encounters:  10/30/17 227 lb 12 oz (103.3 kg)  10/08/17 232 lb 9.6 oz (105.5 kg)  07/23/16 228 lb (103.4 kg)    PHYSICAL EXAM: General: Well appearing. No resp difficulty. HEENT: Normal Neck: Supple. JVP 5-6. Carotids 2+ bilat; no bruits. No thyromegaly or nodule  noted. Cor: PMI nondisplaced. RRR, No M/G/R noted Lungs: CTAB, normal effort. Abdomen: Overweight .Soft, non-tender, non-distended, no HSM. No bruits or masses. +BS  Extremities: No cyanosis, clubbing, or rash. R and LLE no edema.  Neuro: Alert & orientedx3, cranial nerves grossly  intact. moves all 4 extremities w/o difficulty. Affect pleasant   ECG: NSR 92 bpm No ST-T wave abnormalities. , personally reviewed.  ASSESSMENT & PLAN: 1. Cardiac risk factor profile - Calcium score 11/2015 = 0 but with mild soft plaque.  - Continue aggressive Risk Factor modification.  - Exercises 40-45 minutes 4-5 a week.  - Continue ASA and Crestor.  2. Hyperlipidemia with metabolic syndrome - Continue exercise.  - LDL 45 10/21/2017.  3. Erectile dysfunction - Per primary.  4. HTN - Component of [place] syndrome. Continue to follow closely. Stable at PCP last month.  5. Snoring, possible OSA - will arrange home sleep study 6. Ab bruit  - Aortic US 12/2015 unremarkable.aortic u/s 7. Dilated aortic root (4.1 cm) on cardiac CT 11/2015 - Will order Echo.    Recent labwork stable.  Will order Echo.  Yearly follow up. Sooner as needed.   Shirley Friar, PA-C  2:11 PM   Patient seen and examined with the above-signed Advanced Practice Provider and/or Housestaff. I personally reviewed laboratory data, imaging studies and relevant notes. I independently examined the patient and formulated the important aspects of the plan. I have edited the note to reflect any of my changes or salient points. I have personally discussed the plan with the patient and/or family.  Overall doing well. Remains active but seems to have high stress lifestyle. Calcium scoring CT review. Calcium score 0 but does have soft plaque. Will continue ASA and crestor. Continue efforts at weight loss. BP ok. Aortic root minimally dilated on CT. Will get echo to confirm and follow as needed. Snores heavily. I called Dr. Radford Pax and we  will arrange home sleep study.   Glori Bickers, MD  9:41 PM

## 2017-10-30 NOTE — Patient Instructions (Signed)
Your physician has requested that you have an echocardiogram. Echocardiography is a painless test that uses sound waves to create images of your heart. It provides your doctor with information about the size and shape of your heart and how well your heart's chambers and valves are working. This procedure takes approximately one hour. There are no restrictions for this procedure.  We will contact you in 1 year to schedule your next appointment.  

## 2017-11-02 ENCOUNTER — Telehealth: Payer: Self-pay | Admitting: *Deleted

## 2017-11-02 DIAGNOSIS — R0683 Snoring: Secondary | ICD-10-CM

## 2017-11-02 DIAGNOSIS — G4700 Insomnia, unspecified: Secondary | ICD-10-CM

## 2017-11-02 NOTE — Telephone Encounter (Addendum)
Hey, Dr Haroldine Laws spoke w/Dr Radford Pax about this pt, he wants him to have home sleep study

## 2017-11-05 ENCOUNTER — Telehealth: Payer: Self-pay | Admitting: *Deleted

## 2017-11-05 ENCOUNTER — Encounter: Payer: Self-pay | Admitting: Internal Medicine

## 2017-11-05 NOTE — Telephone Encounter (Signed)
Staff message sent to [place]. Patient has [place] and does not require a PA to have a HST.

## 2017-11-05 NOTE — Telephone Encounter (Signed)
Patient is aware and agreeable to Home Sleep Study through [facility]. Patient is scheduled for 12/09/17 at 11:45 to pick up home sleep kit and meet with Respiratory therapist at [facility]. Patient is aware that if this appointment date and time does not work for them they should contact Artis Delay directly at [phone]. Patient is aware that a sleep packet will be sent from [facility] in week.  Left detailed message on voicemail with date and time of titration and informed patient to call back to confirm or reschedule.

## 2017-11-05 NOTE — Telephone Encounter (Signed)
-----   Message from Lauralee Evener, Ramos sent at 11/05/2017  8:31 AM EDT ----- Regarding: RE: PRE CERT His insurance doesn't require precert for a HST. Ok to schedule. ----- Message ----- From: Freada Bergeron, CMA Sent: 11/02/2017   6:33 PM To: [facility] Sleep Studies Subject: PRE CERT                                       HOME SLEEP TEST ----- Message ----- From: Scarlette Calico, RN Sent: 10/30/2017   2:59 PM To: Freada Bergeron, CMA  Hey, Dr Haroldine Laws spoke w/Dr Radford Pax about this pt, he wants him to have home sleep study and she told him to send it to you and you would arrange, please let me know if I need to do anything, thanks

## 2017-11-19 ENCOUNTER — Ambulatory Visit (HOSPITAL_COMMUNITY): Payer: 59 | Attending: Cardiovascular Disease

## 2017-11-19 ENCOUNTER — Other Ambulatory Visit: Payer: Self-pay

## 2017-11-19 DIAGNOSIS — E785 Hyperlipidemia, unspecified: Secondary | ICD-10-CM | POA: Diagnosis not present

## 2017-11-19 DIAGNOSIS — I77810 Thoracic aortic ectasia: Secondary | ICD-10-CM | POA: Diagnosis present

## 2017-11-19 DIAGNOSIS — I10 Essential (primary) hypertension: Secondary | ICD-10-CM | POA: Diagnosis not present

## 2017-12-04 ENCOUNTER — Other Ambulatory Visit: Payer: Self-pay | Admitting: Family Medicine

## 2017-12-04 NOTE — Telephone Encounter (Signed)
Called patient and left a voicemail message asking for a call back. I see that this was previously sent to [facility] in April of this year with 3 refills. Does he wasn't to change pharmacies?

## 2017-12-07 NOTE — Telephone Encounter (Signed)
See note.   Copied from [place] [id number]. Topic: Quick Communication - Office Called Patient >> Dec 07, 2017  9:59 AM Antonieta Iba C wrote: Pt returned call, pt says that he did receive a call to clarify a Rx pharmacy, pt says that he would like to have Lisinopril sent via [facility] mail order. Pt says that he do not want to use local pharmacy.

## 2017-12-09 ENCOUNTER — Ambulatory Visit (HOSPITAL_BASED_OUTPATIENT_CLINIC_OR_DEPARTMENT_OTHER): Payer: 59 | Attending: Internal Medicine | Admitting: Cardiology

## 2017-12-09 DIAGNOSIS — G4733 Obstructive sleep apnea (adult) (pediatric): Secondary | ICD-10-CM

## 2017-12-09 DIAGNOSIS — G4736 Sleep related hypoventilation in conditions classified elsewhere: Secondary | ICD-10-CM | POA: Diagnosis not present

## 2017-12-09 DIAGNOSIS — G4700 Insomnia, unspecified: Secondary | ICD-10-CM | POA: Diagnosis present

## 2017-12-09 DIAGNOSIS — R0683 Snoring: Secondary | ICD-10-CM

## 2017-12-14 DIAGNOSIS — D2261 Melanocytic nevi of right upper limb, including shoulder: Secondary | ICD-10-CM | POA: Diagnosis not present

## 2017-12-14 DIAGNOSIS — L281 Prurigo nodularis: Secondary | ICD-10-CM | POA: Diagnosis not present

## 2017-12-14 DIAGNOSIS — L738 Other specified follicular disorders: Secondary | ICD-10-CM | POA: Diagnosis not present

## 2017-12-14 NOTE — Procedures (Signed)
   Patient Name: Jared Martin, Jared Martin Date: 12/09/2017 Gender: Male D.O.B: Jul 20, 1960 Age (years): 70 Referring Provider: Shaune Pascal Bensimhon Height (inches): 72 Interpreting Physician: Fransico Him MD, ABSM Weight (lbs): 225 RPSGT: Jonna Coup BMI: 31 MRN: [id number] Neck Size: 18.00  CLINICAL INFORMATION Sleep Study Type: HST  Indication for sleep study: Snoring  Epworth Sleepiness Score: 4  SLEEP STUDY TECHNIQUE A multi-channel overnight portable sleep study was performed. The channels recorded were: nasal airflow, thoracic respiratory movement, and oxygen saturation with a pulse oximetry. Snoring was also monitored.  MEDICATIONS Patient self administered medications include: N/A.  SLEEP ARCHITECTURE Patient was studied for 482 minutes. The sleep efficiency was 98.4 % and the patient was supine for 30.2%. The arousal index was 0.0 per hour.  RESPIRATORY PARAMETERS The overall AHI was 5.6 per hour, with a central apnea index of 0.0 per hour.  The oxygen nadir was 83% during sleep.  CARDIAC DATA Mean heart rate during sleep was 71.8 bpm.  IMPRESSIONS - Mild obstructive sleep apnea occurred during this study (AHI = 5.6/h). - No significant central sleep apnea occurred during this study (CAI = 0.0/h). - Moderate oxygen desaturation was noted during this study (Min O2 = 83%). - Patient snored 12.2% during the sleep.  DIAGNOSIS - Obstructive Sleep Apnea (327.23 [G47.33 ICD-10]) - Nocturnal Hypoxemia (327.26 [G47.36 ICD-10])  RECOMMENDATIONS - Therapeutic CPAP titration to determine optimal pressure required to alleviate sleep disordered breathing. - Positional therapy avoiding supine position during sleep. - Avoid alcohol, sedatives and other CNS depressants that may worsen sleep apnea and disrupt normal sleep architecture. - Sleep hygiene should be reviewed to assess factors that may improve sleep quality. - Weight management and regular exercise should be  initiated or continued.  [Electronically signed] 12/14/2017 10:11 PM  Fransico Him MD, ABSM Diplomate, American Board of Sleep Medicine

## 2017-12-16 ENCOUNTER — Telehealth: Payer: Self-pay | Admitting: *Deleted

## 2017-12-16 DIAGNOSIS — G4700 Insomnia, unspecified: Secondary | ICD-10-CM

## 2017-12-16 NOTE — Telephone Encounter (Signed)
Informed patient of sleep study results and patient understanding was verbalized. Patient understands his sleep study showed that they have sleep apnea and recommend CPAP titration. Pt is aware and agreeable these results.

## 2017-12-16 NOTE — Telephone Encounter (Signed)
-----   Message from Sueanne Margarita, MD sent at 12/14/2017 10:13 PM EDT ----- Please let patient know that they have sleep apnea and recommend CPAP titration. Please set up titration in the sleep lab.

## 2017-12-16 NOTE — Telephone Encounter (Signed)
cpap titration sent to sleep pool.

## 2017-12-17 ENCOUNTER — Telehealth: Payer: Self-pay | Admitting: *Deleted

## 2017-12-17 ENCOUNTER — Other Ambulatory Visit (HOSPITAL_COMMUNITY): Payer: Self-pay | Admitting: Internal Medicine

## 2017-12-17 NOTE — Telephone Encounter (Signed)
PA faxed to [facility] for in lab CPAP titration study.

## 2017-12-28 ENCOUNTER — Telehealth: Payer: Self-pay | Admitting: *Deleted

## 2017-12-28 DIAGNOSIS — G4700 Insomnia, unspecified: Secondary | ICD-10-CM

## 2017-12-28 NOTE — Telephone Encounter (Signed)
Upon patient request DME selection is [facility]. Patient understands he will be contacted by [place] to set up his cpap. Patient understands to call if [facility] does not contact him with new setup in a timely manner. Patient understands they will be called once confirmation has been received from[facility] that they have received their new machine to schedule 10 week follow up appointment.  [place] notified of new cpap order  Please add to airview Patient was grateful for the call and thanked me.

## 2017-12-28 NOTE — Telephone Encounter (Signed)
-----   Message -----  From: Freada Bergeron, CMA  Sent: 12/28/2017  1:58 PM  To: Sueanne Margarita, MD  Subject: in lab denied                   Please advise  ----- Message -----  From: Lauralee Evener, CMA  Sent: 12/28/2017 10:38 AM  To: Freada Bergeron, CMA  Subject: RE: precert                    [employer] denied in lab CPAP titration. Recommends APAP. Please notify provider.  ----- Message -----  From: Freada Bergeron, CMA  Sent: 12/16/2017  5:32 PM  To: [facility]  Subject: precert                      cpap titration

## 2017-12-28 NOTE — Telephone Encounter (Signed)
Staff message sent to [facility] denied in lab CPAP titration. recommends APAP. No PA needed for this.

## 2017-12-28 NOTE — Telephone Encounter (Signed)
Please order Airsense CPAP with autotitration from 4 to 18cm H2O with heated humidity and mask of choice. Followup with me in 10 weeks

## 2017-12-28 NOTE — Telephone Encounter (Signed)
  Lauralee Evener, CMA  Freada Bergeron, [place] denied in lab CPAP titration. Recommends APAP. Please notify provider.     ----- Message -----  From: Freada Bergeron, CMA  Sent: 12/16/2017  5:32 PM  To: [facility]  Subject: precert                      cpap titration

## 2017-12-28 NOTE — Telephone Encounter (Signed)
-----   Message from Freada Bergeron, [place] sent at 12/16/2017  5:32 PM EDT ----- Regarding: precert cpap titration

## 2017-12-29 NOTE — Telephone Encounter (Signed)
Order faxed to [facility]. 

## 2018-01-06 DIAGNOSIS — H2513 Age-related nuclear cataract, bilateral: Secondary | ICD-10-CM | POA: Diagnosis not present

## 2018-01-06 DIAGNOSIS — H35363 Drusen (degenerative) of macula, bilateral: Secondary | ICD-10-CM | POA: Diagnosis not present

## 2018-01-08 DIAGNOSIS — G4733 Obstructive sleep apnea (adult) (pediatric): Secondary | ICD-10-CM | POA: Diagnosis not present

## 2018-01-26 NOTE — Telephone Encounter (Signed)
Patient has a 10 week follow up appointment scheduled for .10/7 at 11:40 2019. Patient understands he needs to keep this appointment for insurance compliance. Patient was grateful for the call and thanked me.

## 2018-02-08 DIAGNOSIS — G4733 Obstructive sleep apnea (adult) (pediatric): Secondary | ICD-10-CM | POA: Diagnosis not present

## 2018-02-09 DIAGNOSIS — G4733 Obstructive sleep apnea (adult) (pediatric): Secondary | ICD-10-CM | POA: Diagnosis not present

## 2018-03-11 DIAGNOSIS — G4733 Obstructive sleep apnea (adult) (pediatric): Secondary | ICD-10-CM | POA: Diagnosis not present

## 2018-03-15 DIAGNOSIS — Z23 Encounter for immunization: Secondary | ICD-10-CM | POA: Diagnosis not present

## 2018-03-17 DIAGNOSIS — L4059 Other psoriatic arthropathy: Secondary | ICD-10-CM | POA: Diagnosis not present

## 2018-03-17 DIAGNOSIS — L409 Psoriasis, unspecified: Secondary | ICD-10-CM | POA: Diagnosis not present

## 2018-03-17 LAB — HEPATIC FUNCTION PANEL
ALK PHOS: 71 (ref 25–125)
ALT: 34 (ref 10–40)
AST: 35 (ref 14–40)
Bilirubin, Total: 0.5

## 2018-03-17 LAB — CBC AND DIFFERENTIAL
HCT: 43 (ref 41–53)
HEMOGLOBIN: 14.6 (ref 13.5–17.5)
Neutrophils Absolute: 4
PLATELETS: 244 (ref 150–399)
WBC: 5.2

## 2018-03-17 LAB — BASIC METABOLIC PANEL
BUN: 18 (ref 4–21)
CREATININE: 1.4 — AB (ref 0.6–1.3)
Glucose: 109
POTASSIUM: 4 (ref 3.4–5.3)
Sodium: 142 (ref 137–147)

## 2018-03-29 ENCOUNTER — Encounter: Payer: Self-pay | Admitting: Cardiology

## 2018-03-29 ENCOUNTER — Ambulatory Visit (INDEPENDENT_AMBULATORY_CARE_PROVIDER_SITE_OTHER): Payer: 59 | Admitting: Cardiology

## 2018-03-29 VITALS — BP 130/70 | HR 86 | Ht 72.75 in | Wt 232.8 lb

## 2018-03-29 DIAGNOSIS — I10 Essential (primary) hypertension: Secondary | ICD-10-CM | POA: Diagnosis not present

## 2018-03-29 DIAGNOSIS — G4733 Obstructive sleep apnea (adult) (pediatric): Secondary | ICD-10-CM | POA: Diagnosis not present

## 2018-03-29 HISTORY — DX: Obstructive sleep apnea (adult) (pediatric): G47.33

## 2018-03-29 NOTE — Addendum Note (Signed)
Addended by: Sarina Ill on: 03/29/2018 12:34 PM   Modules accepted: Orders

## 2018-03-29 NOTE — Progress Notes (Signed)
Cardiology Office Note:    Date:  03/29/2018   ID:  Jared Martin, DOB 1960/11/03, MRN [id number]  PCP:  Marin Olp, MD  Cardiologist:  No primary care provider on file.    Referring MD: Marin Olp, MD   Chief Complaint  Patient presents with  . Sleep Apnea    History of Present Illness:    Jared Martin is a 57 y.o. male with a hx of hypertension, hyperlipidemia, hyperlipidemia with metabolic syndrome and snoring who was referred by Dr. Haroldine Laws for evaluation for possible sleep apnea.  He underwent home sleep study which showed mild obstructive sleep apnea with an AHI of 5.6/h and nocturnal hypoxemia with O2 saturations less than 89% for 30 minutes out of total sleep time.  Insurance denied in lab study and he underwent CPAP auto titration at home.  He is now here for follow-up.he is doing well with his CPAP device and thinks that he has gotten used to it.  He tolerates the mask and feels the pressure is adequate.  Since going on CPAP he feels rested in the am and has no significant daytime sleepiness.  He denies any significant mouth or nasal dryness or nasal congestion.  He does not think that he snores.     Past Medical History:  Diagnosis Date  . Abdominal bruit 12/28/2015  . Arthritis   . Erectile dysfunction 02/17/2014   Viagra   . GAD (generalized anxiety disorder) 02/17/2014   Cymbalta 120mg  daily. Xanax use typically once a month maximum. Consider CBT if worsens (never had)   . Hyperlipemia 11/22/2015  . Hypertension   . Hypothyroidism 02/17/2014   S/p total thyroidectomy due to history thyroid cancer. Follows still at [facility] with endocrinologist.    . Immunosuppression ([place]) 02/17/2014   On Enbrel-no live vaccines. Response may be less to typical pneumovax, tdap.    . Insomnia 02/17/2014   Intermittent Ambien use when not at home. Typically sleeps well at home.    . OSA (obstructive sleep apnea) 03/29/2018   Mild with AHI 5.6/hr with nocturnal hypoxemia.   Now on CPAP.  [name] Psoriatic arthritis ([place]) 02/17/2014   Dr. Ouida Sills [facility]. On Enbrel. Discovered at time of knee replacement.    . Sciatica of left side 02/17/2014   Following [facility].  04/05/14  Neurosurgery Dr. Ellene Route who recommended discdecompression for herniated disc at L4-L5 and L5-s1   . Thyroid cancer ([place])    s/p removal, now hypothyroid  . Total knee replacement status 02/17/2014   Clindamycin prn for dental prophylaxis-prescribed by dentist.      Past Surgical History:  Procedure Laterality Date  . CERVICAL DISCECTOMY  2015  . KNEE SURGERY Right 1980-2011  . LUMBAR DISC SURGERY     l4-l5, l5-s1- ruptured disc. Dr. Ellene Route  . right foot Right 2005  . SHOULDER SURGERY Right 1983  . THYROIDECTOMY Bilateral 2000    Current Medications: Current Meds  Medication Sig  . ALPRAZolam (XANAX) 0.25 MG tablet Take 1 tablet (0.25 mg total) by mouth at bedtime as needed for anxiety.  [name] aspirin EC 81 MG tablet Take 1 tablet (81 mg total) by mouth daily.  . Calcium Carbonate-Vitamin D (CALCIUM-VITAMIN D) 500-200 MG-UNIT tablet Take 1 tablet by mouth daily.  . cholecalciferol (VITAMIN D) 1000 units tablet Take 2,000 Units by mouth daily.  . clindamycin (CLEOCIN) 150 MG capsule Take 150 mg by mouth as needed.  . DULoxetine (CYMBALTA) 60 MG capsule Take 1  capsule (60 mg total) by mouth 2 (two) times daily.  [name] etanercept (ENBREL SURECLICK) 50 MG/ML injection Inject 50 mg into the skin once a week.  . levothyroxine (SYNTHROID, LEVOTHROID) 125 MCG tablet Take 1 tablet (125 mcg total) by mouth 2 (two) times daily.  [name] lisinopril (PRINIVIL,ZESTRIL) 5 MG tablet Take 1 tablet (5 mg total) by mouth daily.  . Multiple Vitamin (MULTIVITAMIN) capsule Take 1 capsule by mouth daily.  . rosuvastatin (CRESTOR) 20 MG tablet Take 1 tablet (20 mg total) by mouth daily.  . sildenafil (VIAGRA) 50 MG tablet Take 1 tablet (50 mg total) by mouth daily as needed for erectile  dysfunction.  [name] zolpidem (AMBIEN) 10 MG tablet Take 1 tablet (10 mg total) by mouth at bedtime as needed for sleep.     Allergies:   Penicillins   Social History   Socioeconomic History  . Marital status: Married    Spouse name: Not on file  . Number of children: Not on file  . Years of education: Not on file  . Highest education level: Not on file  Occupational History  . Not on file  Social Needs  . Financial resource strain: Not on file  . Food insecurity:    Worry: Not on file    Inability: Not on file  . Transportation needs:    Medical: Not on file    Non-medical: Not on file  Tobacco Use  . Smoking status: Never Smoker  . Smokeless tobacco: Never Used  Substance and Sexual Activity  . Alcohol use: Yes    Alcohol/week: 8.0 - 10.0 standard drinks    Types: 8 - 10 Standard drinks or equivalent per week  . Drug use: No  . Sexual activity: Yes    Partners: Female  Lifestyle  . Physical activity:    Days per week: Not on file    Minutes per session: Not on file  . Stress: Not on file  Relationships  . Social connections:    Talks on phone: Not on file    Gets together: Not on file    Attends religious service: Not on file    Active member of club or organization: Not on file    Attends meetings of clubs or organizations: Not on file    Relationship status: Not on file  Other Topics Concern  . Not on file  Social History Narrative   Married for 29 years in 2016. 3 kids-1 at [place], 1 at [place]--> now at [employer], 1 at [employer]- injury  Preventing from playing baseball.       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  ROS:     Please see the history of present illness.    ROS  All other systems reviewed and negative.   EKGs/Labs/Other Studies Reviewed:    The following studies were reviewed today: PAP download  EKG:  EKG is not ordered today.    Recent Labs: 10/21/2017: ALT 32; BUN 17; Creatinine, Ser 1.28; Hemoglobin 15.7; Platelets 278.0; Potassium 4.5; Sodium 138; TSH 0.46   Recent Lipid Panel    Component Value Date/Time  CHOL 126 10/21/2017 0759   TRIG 142.0 10/21/2017 0759   HDL 51.80 10/21/2017 0759   CHOLHDL 2 10/21/2017 0759   VLDL 28.4 10/21/2017 0759   LDLCALC 45 10/21/2017 0759   LDLDIRECT 152.0 02/15/2015 0849    Physical Exam:    VS:  BP 130/70   Pulse 86   Ht 6' 0.75" (1.848 m)   Wt 232 lb 12.8 oz (105.6 kg)   SpO2 98%   BMI 30.93 kg/m     Wt Readings from Last 3 Encounters:  03/29/18 232 lb 12.8 oz (105.6 kg)  10/30/17 227 lb 12 oz (103.3 kg)  10/08/17 232 lb 9.6 oz (105.5 kg)     GEN:  Well nourished, well developed in no acute distress HEENT: Normal NECK: No JVD; No carotid bruits LYMPHATICS: No lymphadenopathy CARDIAC: RRR, no murmurs, rubs, gallops RESPIRATORY:  Clear to auscultation without rales, wheezing or rhonchi  ABDOMEN: Soft, non-tender, non-distended MUSCULOSKELETAL:  No edema; No deformity  SKIN: Warm and dry NEUROLOGIC:  Alert and oriented x 3 PSYCHIATRIC:  Normal affect   ASSESSMENT:    1. OSA (obstructive sleep apnea)   2. Essential hypertension, benign    PLAN:    In order of problems listed above:  1.  OSA - the patient is tolerating PAP therapy well without any problems. The PAP download was reviewed today and showed an AHI of 1.2/hr on auto PAP with 97% compliance in using more than 4 hours nightly.  The patient has been using and benefiting from PAP use and will continue to benefit from therapy.  I will get an overnight pulse oximetry on CPAP to make sure he does not have any residual hypoxemia.  2.  HTN - BP is well controlled on exam  today.  He will continue on lisinopril 5 mg daily     Medication Adjustments/Labs and Tests Ordered: Current medicines are reviewed at length with the patient today.  Concerns regarding medicines are outlined above.  No orders of the defined types were placed in this encounter.  No orders of the defined types were placed in this encounter.   Signed, Fransico Him, MD  03/29/2018 12:08 PM    [place]

## 2018-03-29 NOTE — Patient Instructions (Signed)
Medication Instructions:  Your physician recommends that you continue on your current medications as directed. Please refer to the Current Medication list given to you today.  If you need a refill on your cardiac medications before your next appointment, please call your pharmacy.   Lab work: If you have labs (blood work) drawn today and your tests are completely normal, you will receive your results only by: [name] MyChart Message (if you have MyChart) OR . A paper copy in the mail If you have any lab test that is abnormal or we need to change your treatment, we will call you to review the results.  Testing/Procedures:  Overnight pulse oximetry on CPAP  Follow-Up: At [facility], you and your health needs are our priority.  As part of our continuing mission to provide you with exceptional heart care, we have created designated Provider Care Teams.  These Care Teams include your primary Cardiologist (physician) and Advanced Practice Providers (APPs -  Physician Assistants and Nurse Practitioners) who all work together to provide you with the care you need, when you need it. You will need a follow up appointment in 1 years.  Please call our office 2 months in advance to schedule this appointment.  You may see Dr. Radford Pax or one of the following Advanced Practice Providers on your designated Care Team:   [place], PA-C Melina Copa, PA-C . Ermalinda Barrios, PA-C

## 2018-03-30 ENCOUNTER — Encounter: Payer: Self-pay | Admitting: Family Medicine

## 2018-03-30 ENCOUNTER — Telehealth: Payer: Self-pay | Admitting: *Deleted

## 2018-03-30 NOTE — Telephone Encounter (Signed)
-----   Message from Sarina Ill, RN sent at 03/29/2018  1:29 PM EDT ----- Hello, Dr. Radford Pax ordered an overnight pulse ox. Order placed. Thanks,  [employer]

## 2018-04-05 ENCOUNTER — Ambulatory Visit: Payer: 59 | Admitting: Cardiology

## 2018-04-09 NOTE — Telephone Encounter (Signed)
Order sent to [facility] to order an overnight pulse ox

## 2018-04-10 DIAGNOSIS — G4733 Obstructive sleep apnea (adult) (pediatric): Secondary | ICD-10-CM | POA: Diagnosis not present

## 2018-04-16 ENCOUNTER — Other Ambulatory Visit: Payer: Self-pay | Admitting: Family Medicine

## 2018-04-20 DIAGNOSIS — R0902 Hypoxemia: Secondary | ICD-10-CM | POA: Diagnosis not present

## 2018-04-20 DIAGNOSIS — J449 Chronic obstructive pulmonary disease, unspecified: Secondary | ICD-10-CM | POA: Diagnosis not present

## 2018-04-27 ENCOUNTER — Telehealth: Payer: Self-pay | Admitting: *Deleted

## 2018-04-27 DIAGNOSIS — G4733 Obstructive sleep apnea (adult) (pediatric): Secondary | ICD-10-CM

## 2018-04-27 NOTE — Telephone Encounter (Signed)
-----   Message from Sueanne Margarita, MD sent at 04/26/2018  6:12 PM EST ----- Patient has > 5 consecutive minutes with O2 Sats </= 88%.  Qualifies for O2. Please order O2 at 2L via CPAP nightly and repeat pulse ox overnight on CPAP and O2

## 2018-04-27 NOTE — Telephone Encounter (Signed)
Order faxed to [facility] to [phone] and sent via community message.

## 2018-04-30 DIAGNOSIS — G4733 Obstructive sleep apnea (adult) (pediatric): Secondary | ICD-10-CM | POA: Diagnosis not present

## 2018-04-30 DIAGNOSIS — R0902 Hypoxemia: Secondary | ICD-10-CM | POA: Diagnosis not present

## 2018-05-11 DIAGNOSIS — G4733 Obstructive sleep apnea (adult) (pediatric): Secondary | ICD-10-CM | POA: Diagnosis not present

## 2018-05-19 DIAGNOSIS — G4733 Obstructive sleep apnea (adult) (pediatric): Secondary | ICD-10-CM | POA: Diagnosis not present

## 2018-05-19 DIAGNOSIS — J449 Chronic obstructive pulmonary disease, unspecified: Secondary | ICD-10-CM | POA: Diagnosis not present

## 2018-05-19 DIAGNOSIS — R0902 Hypoxemia: Secondary | ICD-10-CM | POA: Diagnosis not present

## 2018-05-24 ENCOUNTER — Encounter: Payer: Self-pay | Admitting: Family Medicine

## 2018-05-30 DIAGNOSIS — G4733 Obstructive sleep apnea (adult) (pediatric): Secondary | ICD-10-CM | POA: Diagnosis not present

## 2018-05-30 DIAGNOSIS — R0902 Hypoxemia: Secondary | ICD-10-CM | POA: Diagnosis not present

## 2018-06-04 NOTE — Telephone Encounter (Signed)
Spoke with the patient, he accepted Dr. Theodosia Blender recommendations.

## 2018-06-10 DIAGNOSIS — G4733 Obstructive sleep apnea (adult) (pediatric): Secondary | ICD-10-CM | POA: Diagnosis not present

## 2018-06-23 DIAGNOSIS — T783XXA Angioneurotic edema, initial encounter: Secondary | ICD-10-CM | POA: Diagnosis not present

## 2018-06-23 DIAGNOSIS — Z9989 Dependence on other enabling machines and devices: Secondary | ICD-10-CM | POA: Diagnosis not present

## 2018-06-23 DIAGNOSIS — E785 Hyperlipidemia, unspecified: Secondary | ICD-10-CM | POA: Diagnosis not present

## 2018-06-23 DIAGNOSIS — I129 Hypertensive chronic kidney disease with stage 1 through stage 4 chronic kidney disease, or unspecified chronic kidney disease: Secondary | ICD-10-CM | POA: Diagnosis not present

## 2018-06-23 DIAGNOSIS — Z79899 Other long term (current) drug therapy: Secondary | ICD-10-CM | POA: Diagnosis not present

## 2018-06-23 DIAGNOSIS — Z7982 Long term (current) use of aspirin: Secondary | ICD-10-CM | POA: Diagnosis not present

## 2018-06-23 DIAGNOSIS — Z9889 Other specified postprocedural states: Secondary | ICD-10-CM | POA: Diagnosis not present

## 2018-06-23 DIAGNOSIS — F329 Major depressive disorder, single episode, unspecified: Secondary | ICD-10-CM | POA: Diagnosis not present

## 2018-06-23 DIAGNOSIS — Z888 Allergy status to other drugs, medicaments and biological substances status: Secondary | ICD-10-CM | POA: Diagnosis not present

## 2018-06-23 DIAGNOSIS — Z79890 Hormone replacement therapy (postmenopausal): Secondary | ICD-10-CM | POA: Diagnosis not present

## 2018-06-23 DIAGNOSIS — E890 Postprocedural hypothyroidism: Secondary | ICD-10-CM | POA: Diagnosis not present

## 2018-06-23 DIAGNOSIS — N183 Chronic kidney disease, stage 3 (moderate): Secondary | ICD-10-CM | POA: Diagnosis not present

## 2018-06-23 DIAGNOSIS — Z880 Allergy status to penicillin: Secondary | ICD-10-CM | POA: Diagnosis not present

## 2018-06-23 DIAGNOSIS — G4733 Obstructive sleep apnea (adult) (pediatric): Secondary | ICD-10-CM | POA: Diagnosis not present

## 2018-06-30 DIAGNOSIS — G4733 Obstructive sleep apnea (adult) (pediatric): Secondary | ICD-10-CM | POA: Diagnosis not present

## 2018-06-30 DIAGNOSIS — H903 Sensorineural hearing loss, bilateral: Secondary | ICD-10-CM | POA: Diagnosis not present

## 2018-07-06 ENCOUNTER — Encounter: Payer: Self-pay | Admitting: Family Medicine

## 2018-07-06 ENCOUNTER — Other Ambulatory Visit: Payer: Self-pay

## 2018-07-06 MED ORDER — DULOXETINE HCL 60 MG PO CPEP: 60.0000 mg | ORAL_CAPSULE | Freq: Two times a day (BID) | ORAL | 2 refills | Status: DC

## 2018-07-06 MED ORDER — LISINOPRIL 5 MG PO TABS: 5.0000 mg | ORAL_TABLET | Freq: Every day | ORAL | 3 refills | Status: DC

## 2018-07-06 MED ORDER — SILDENAFIL CITRATE 50 MG PO TABS: 50.0000 mg | ORAL_TABLET | Freq: Every day | ORAL | 10 refills | Status: DC | PRN

## 2018-07-06 MED ORDER — ROSUVASTATIN CALCIUM 20 MG PO TABS: 20.0000 mg | ORAL_TABLET | Freq: Every day | ORAL | 3 refills | Status: DC

## 2018-07-06 MED ORDER — LEVOTHYROXINE SODIUM 125 MCG PO TABS: 125.0000 ug | ORAL_TABLET | Freq: Two times a day (BID) | ORAL | 0 refills | Status: DC

## 2018-07-14 ENCOUNTER — Other Ambulatory Visit (HOSPITAL_COMMUNITY): Payer: Self-pay

## 2018-07-14 ENCOUNTER — Telehealth (HOSPITAL_COMMUNITY): Payer: Self-pay

## 2018-07-14 MED ORDER — AMLODIPINE BESYLATE 10 MG PO TABS: 10.0000 mg | ORAL_TABLET | Freq: Every day | ORAL | 0 refills | Status: DC

## 2018-07-14 NOTE — Telephone Encounter (Signed)
Pt called and stated that he was in the ED in [facility] for his angioedema and they prescribed him Amlodopine 10 mg. Pt is currently out of med and needs a refill. Do you advise this?

## 2018-07-14 NOTE — Telephone Encounter (Signed)
If he has been stable on this than OK for refill.  Keep appt with Dr. Haroldine Laws.     Legrand Como [address] [place], [place] 07/14/2018 9:38 AM

## 2018-07-14 NOTE — Telephone Encounter (Signed)
Pt notified Rx sent to pharmacy

## 2018-07-31 DIAGNOSIS — G4733 Obstructive sleep apnea (adult) (pediatric): Secondary | ICD-10-CM | POA: Diagnosis not present

## 2018-08-11 DIAGNOSIS — G4733 Obstructive sleep apnea (adult) (pediatric): Secondary | ICD-10-CM | POA: Diagnosis not present

## 2018-08-24 ENCOUNTER — Other Ambulatory Visit (HOSPITAL_COMMUNITY): Payer: Self-pay

## 2018-08-24 MED ORDER — AMLODIPINE BESYLATE 10 MG PO TABS: 10.0000 mg | ORAL_TABLET | Freq: Every day | ORAL | 0 refills | Status: DC

## 2018-08-27 DIAGNOSIS — G4733 Obstructive sleep apnea (adult) (pediatric): Secondary | ICD-10-CM | POA: Diagnosis not present

## 2018-09-06 ENCOUNTER — Other Ambulatory Visit: Payer: Self-pay | Admitting: Family Medicine

## 2018-09-06 NOTE — Telephone Encounter (Signed)
Copied from [place] [phone]. Topic: Quick Communication - Rx Refill/Question >> Sep 06, 2018  1:48 PM Margot Ables wrote: Medication: levothyroxine (SYNTHROID, LEVOTHROID) 125 MCG tablet - 90 day supply please  Has the patient contacted their pharmacy? Yes - this RX wasn't transferred to new pharmacy - cannot get at [facility] Preferred [facility] (with phone number or street name): [facility] - [place], [place] [phone] (Phone) [phone] (Fax)

## 2018-09-07 MED ORDER — LEVOTHYROXINE SODIUM 125 MCG PO TABS: 125.0000 ug | ORAL_TABLET | Freq: Two times a day (BID) | ORAL | 0 refills | Status: DC

## 2018-09-09 DIAGNOSIS — G4733 Obstructive sleep apnea (adult) (pediatric): Secondary | ICD-10-CM | POA: Diagnosis not present

## 2018-09-15 ENCOUNTER — Telehealth (HOSPITAL_COMMUNITY): Payer: Self-pay | Admitting: Vascular Surgery

## 2018-09-15 DIAGNOSIS — L4059 Other psoriatic arthropathy: Secondary | ICD-10-CM | POA: Diagnosis not present

## 2018-09-15 DIAGNOSIS — L409 Psoriasis, unspecified: Secondary | ICD-10-CM | POA: Diagnosis not present

## 2018-09-15 NOTE — Telephone Encounter (Signed)
Left pt message to resch 4/1 app w/ db until June, pt will call office to get resch

## 2018-09-16 NOTE — Telephone Encounter (Signed)
Patient returned call regarding rescheduling upcoming appointment, however he expressed multiple concerns he had wanted to discuss at upcoming appt. (BLE edema, and rash since starting new med)   Attempted to return call to further discuss [facility] @ 1526

## 2018-09-17 ENCOUNTER — Encounter (HOSPITAL_COMMUNITY): Payer: Self-pay | Admitting: Cardiology

## 2018-09-17 NOTE — Telephone Encounter (Signed)
Left message for patient to schedule televisit

## 2018-09-17 NOTE — Telephone Encounter (Signed)
Patient agreeable to virtual visit consent and televisit consent sent via mychart 4/1 @12 

## 2018-09-17 NOTE — Telephone Encounter (Signed)
Please set up for televisit.   Jared Martin

## 2018-09-22 ENCOUNTER — Other Ambulatory Visit: Payer: Self-pay

## 2018-09-22 ENCOUNTER — Encounter (HOSPITAL_COMMUNITY): Payer: 59 | Admitting: Internal Medicine

## 2018-09-22 ENCOUNTER — Encounter (HOSPITAL_COMMUNITY): Payer: Self-pay | Admitting: *Deleted

## 2018-09-22 ENCOUNTER — Ambulatory Visit (HOSPITAL_COMMUNITY)
Admission: RE | Admit: 2018-09-22 | Discharge: 2018-09-22 | Disposition: A | Discharge status: Home / Self Care | Payer: BLUE CROSS/BLUE SHIELD | Source: Ambulatory Visit | Attending: Internal Medicine | Admitting: Internal Medicine

## 2018-09-22 DIAGNOSIS — I10 Essential (primary) hypertension: Secondary | ICD-10-CM

## 2018-09-22 MED ORDER — LOSARTAN POTASSIUM 50 MG PO TABS: 50.0000 mg | ORAL_TABLET | Freq: Every day | ORAL | 3 refills | Status: DC

## 2018-09-22 NOTE — Patient Instructions (Addendum)
Stop Lisinopril  Start Losartan 50 mg daily, prescription has been sent to your pharmacy  Please check your blood pressure at home and keep a record of the readings  Your physician recommends that you schedule a follow-up appointment in: 4 weeks with another telephone visit with Dr Haroldine Laws, our office will call you to schedule this.

## 2018-09-22 NOTE — Progress Notes (Signed)
New RX for Losartan sent to [facility] per Dr Haroldine Laws.  Pt's AVS was sent to him via mychart.

## 2018-09-22 NOTE — Progress Notes (Signed)
Heart Failure TeleHealth Note  Due to national recommendations of social distancing due to [place] 19, Audio/video telehealth visit is felt to be most appropriate for this patient at this time.  See MyChart message from today for patient consent regarding telehealth for [facility]  ID:  Jared Martin, DOB 07-15-1960, MRN [id number]  Location: Home  Provider location: [place] Type of Visit: Established patient, etc  PCP:  Marin Olp, MD  Cardiologist:  No primary care provider on file. 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   He presents via Psychiatric nurse for a telehealth visit today. He had angioedema in January on Lisinopril and was hospitalized in [place]. He is now on amlodipine and now has BLE edema with rash. Last BP was 152/72 at MD office. But under a lot of stress with COVID crisis as he is head of HR for [employer]. Doing power walks 40-50 5x week without SOB or CP. Not checking BP at home.   Pt denies symptoms of cough, fevers, chills, or new SOB worrisome for COVID 19.    Past Medical History:  Diagnosis Date  . Abdominal bruit 12/28/2015  . Arthritis   . Erectile dysfunction 02/17/2014   Viagra   . GAD (generalized anxiety disorder) 02/17/2014   Cymbalta 120mg  daily. Xanax use typically once a month maximum. Consider CBT if worsens (never had)   . Hyperlipemia 11/22/2015  . Hypertension   . Hypothyroidism 02/17/2014   S/p total thyroidectomy due to history thyroid cancer. Follows still at [facility] with endocrinologist.    . Immunosuppression ([place]) 02/17/2014   On Enbrel-no live vaccines. Response may be less to typical pneumovax, tdap.    . Insomnia 02/17/2014   Intermittent Ambien use when not at home.  Typically sleeps well at home.    . OSA (obstructive sleep apnea) 03/29/2018   Mild with AHI 5.6/hr with nocturnal hypoxemia.  Now on CPAP.  [name] Psoriatic arthritis ([place]) 02/17/2014   Dr. Ouida Sills [facility]. On Enbrel. Discovered at time of knee replacement.    . Sciatica of left side 02/17/2014   Following [facility].  04/05/14  Neurosurgery Dr. Ellene Route who recommended discdecompression for herniated disc at L4-L5 and L5-s1   . Thyroid cancer ([place])    s/p removal, now hypothyroid  . Total knee replacement status 02/17/2014   Clindamycin prn for dental prophylaxis-prescribed by dentist.     Past Surgical History:  Procedure Laterality Date  . CERVICAL DISCECTOMY  2015  . KNEE SURGERY Right 1980-2011  . LUMBAR DISC SURGERY     l4-l5, l5-s1- ruptured disc. Dr. Ellene Route  . right foot Right 2005  . SHOULDER SURGERY Right 1983  . THYROIDECTOMY Bilateral 2000     Current Outpatient Medications  Medication Sig Dispense Refill  . ALPRAZolam (XANAX) 0.25 MG tablet Take 1 tablet (0.25 mg total) by mouth at bedtime as needed for anxiety. 30 tablet 0  . amLODipine (NORVASC) 10 MG tablet Take 1 tablet (10 mg total) by mouth daily. 90 tablet 0  . aspirin EC 81 MG tablet Take 1 tablet (81 mg total) by mouth daily. 90 tablet 3  . Calcium Carbonate-Vitamin D (CALCIUM-VITAMIN D) 500-200 MG-UNIT tablet Take 1 tablet by mouth  daily.    . cholecalciferol (VITAMIN D) 1000 units tablet Take 2,000 Units by mouth daily.    . clindamycin (CLEOCIN) 150 MG capsule Take 150 mg by mouth as needed.    . DULoxetine (CYMBALTA) 60 MG capsule Take 1 capsule (60 mg total) by mouth 2 (two) times daily. 180 capsule 2  . etanercept (ENBREL SURECLICK) 50 MG/ML injection Inject 50 mg into the skin once a week.    . levothyroxine (SYNTHROID, LEVOTHROID) 125 MCG tablet Take 1 tablet (125 mcg total) by mouth 2 (two) times daily. 180 tablet 0  . lisinopril (PRINIVIL,ZESTRIL) 5 MG tablet Take 1 tablet (5 mg  total) by mouth daily. 90 tablet 3  . Multiple Vitamin (MULTIVITAMIN) capsule Take 1 capsule by mouth daily.    . rosuvastatin (CRESTOR) 20 MG tablet Take 1 tablet (20 mg total) by mouth daily. 90 tablet 3  . sildenafil (VIAGRA) 50 MG tablet Take 1 tablet (50 mg total) by mouth daily as needed for erectile dysfunction. 10 tablet 10  . zolpidem (AMBIEN) 10 MG tablet Take 1 tablet (10 mg total) by mouth at bedtime as needed for sleep. 30 tablet 2   No current facility-administered medications for this encounter.     Allergies:   Penicillins   Social History:  The patient  reports that he has never smoked. He has never used smokeless tobacco. He reports current alcohol use of about 8.0 - 10.0 standard drinks of alcohol per week. He reports that he does not use drugs.   Family History:  The patient's family history includes Cancer in his mother; Diabetes in his sister; Hypertension in his father and mother.   ROS:  Please see the history of present illness.   All other systems are personally reviewed and negative.   Exam:  [facility] Call) Lungs: Normal respiratory effort with conversation.  Neuro: Alert & oriented x 3.  Mild pitting ankle edema by report  Recent Labs: 10/21/2017: TSH 0.46 03/17/2018: ALT 34; BUN 18; Creatinine 1.4; Hemoglobin 14.6; Platelets 244; Potassium 4.0; Sodium 142  Personally reviewed   Wt Readings from Last 3 Encounters:  03/29/18 105.6 kg (232 lb 12.8 oz)  10/30/17 103.3 kg (227 lb 12 oz)  10/08/17 105.5 kg (232 lb 9.6 oz)      ASSESSMENT AND PLAN:  1. BLE edema and rash -  Due to amlodipine. Will stop Can warp legs with ACE wraps as needed 2. HTN, uncontrolled - Unable to tolerate amlodipine or lisinopril. Added both to allergy list. Will stop amlodipine - Start losartan 50mg  daily (we discussed 10% cross-over risk of angioedema) - Have instructed him to order BP cuff and follow BPs daily - Continue exercise, stress reduction and avoiding salt 3.  Cardiac risk factor profile - Calcium score 11/2015 = 0 but with mild soft plaque.  - Continue aggressive Risk Factor modification.  - Exercises 40-45 minutes 4-5 a week.  - Continue ASA and Crestor. No change.  4. Hyperlipidemia with metabolic syndrome - Continue exercise and Crestor - LDL 45 10/21/2017.   - will recheck when COVID restrictions lifted 5. OSA - Continue CPAP 6. Ab bruit  - Aortic US 12/2015 unremarkable. No change.  7. Dilated aortic root (4.1 cm) on cardiac CT 11/2015 - Aortic root normal on echo 10/2017 - repeat echo later this year  F/u visit 4 weeks  COVID screen The patient does not have any symptoms that suggest any further testing/ screening at this time.  Social distancing reinforced  today.  Patient Risk: After full review of this patients clinical status, I feel that they are at moderate risk for cardiac decompensation at this time.  Today, I have spent 16 minutes with the patient with telehealth technology discussing *  Signed,  Glori Bickers, MD  1:12 PM   [facility] [place] [address] [place] [address] [phone] (office) [phone] (fax)

## 2018-10-10 DIAGNOSIS — G4733 Obstructive sleep apnea (adult) (pediatric): Secondary | ICD-10-CM | POA: Diagnosis not present

## 2018-10-18 ENCOUNTER — Telehealth (HOSPITAL_COMMUNITY): Payer: BLUE CROSS/BLUE SHIELD | Admitting: Internal Medicine

## 2018-10-25 ENCOUNTER — Encounter (HOSPITAL_COMMUNITY): Payer: Self-pay

## 2018-10-25 ENCOUNTER — Ambulatory Visit (HOSPITAL_COMMUNITY)
Admission: RE | Admit: 2018-10-25 | Discharge: 2018-10-25 | Disposition: A | Discharge status: Home / Self Care | Payer: BLUE CROSS/BLUE SHIELD | Source: Ambulatory Visit | Attending: Internal Medicine | Admitting: Internal Medicine

## 2018-10-25 ENCOUNTER — Other Ambulatory Visit: Payer: Self-pay

## 2018-10-25 DIAGNOSIS — I10 Essential (primary) hypertension: Secondary | ICD-10-CM | POA: Diagnosis not present

## 2018-10-25 NOTE — Progress Notes (Signed)
Heart Failure TeleHealth Note  Due to national recommendations of social distancing due to [place] 19, Audio/video telehealth visit is felt to be most appropriate for this patient at this time.  See MyChart message from today for patient consent regarding telehealth for [facility].  ID:  Jared Martin, DOB September 16, 1960, MRN [id number]  Location: Home  Provider location: [place] Type of Visit: Established patient, etc  PCP:  Marin Olp, MD  Cardiologist:  No primary care provider on file. Primary HF: Dr Haroldine Laws  Chief Complaint: HTN   History of Present Illness: Jared Martin is a 58 y.o. 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   He presents via Psychiatric nurse for a telehealth visit today. He had angioedema in January on Lisinopril and was hospitalized in [place]. He was placed on amlodipine and had BLE edema with rash. I switched him to losartan 50 daily last month (with acknowledgement of 10% cross-reactivity with ACE for angioedema). Tolerating losartan well. Edema resolved after stopping amlodipine. Increased walking program about 1 month ago and lost 10 pounds, Had labs with elevated CBG and is now watching diet and carbs. Doing power walks 40-50 5-6x week without SOB or CP. Has home BP cuff. SBP 130-145. 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    Pt denies symptoms of cough, fevers, chills, or new SOB worrisome for COVID 19.    Past Medical History:  Diagnosis Date  . Abdominal bruit 12/28/2015  . Arthritis   . Erectile dysfunction 02/17/2014   Viagra   . GAD (generalized anxiety disorder) 02/17/2014   Cymbalta 120mg  daily. Xanax use typically once a month maximum. Consider CBT if worsens (never had)   . Hyperlipemia 11/22/2015  . Hypertension   . Hypothyroidism 02/17/2014   S/p total  thyroidectomy due to history thyroid cancer. [facility] with endocrinologist.    . Immunosuppression ([place]) 02/17/2014   On Enbrel-no live vaccines. Response may be less to typical pneumovax, tdap.    . Insomnia 02/17/2014   Intermittent Ambien use when not at home. Typically sleeps well at home.    . OSA (obstructive sleep apnea) 03/29/2018   Mild with AHI 5.6/hr with nocturnal hypoxemia.  Now on CPAP.  [name] Psoriatic arthritis ([place]) 02/17/2014   Dr. Ouida Sills [facility]. On Enbrel. Discovered at time of knee replacement.    . Sciatica of left side 02/17/2014   Following [facility].  04/05/14  Neurosurgery Dr. Ellene Route who recommended discdecompression for herniated disc at L4-L5 and L5-s1   . Thyroid cancer ([place])    s/p removal, now hypothyroid  . Total knee replacement status 02/17/2014   Clindamycin prn for dental prophylaxis-prescribed by dentist.     Past Surgical History:  Procedure Laterality Date  . CERVICAL DISCECTOMY  2015  . KNEE SURGERY Right 1980-2011  . LUMBAR DISC SURGERY     l4-l5, l5-s1- ruptured disc. Dr. Ellene Route  . right foot Right 2005  . SHOULDER SURGERY Right 1983  . THYROIDECTOMY Bilateral 2000     Current Outpatient Medications  Medication Sig Dispense Refill  . ALPRAZolam (XANAX) 0.25 MG tablet Take 1 tablet (0.25 mg total) by mouth at bedtime as needed for anxiety. 30 tablet 0  . amLODipine (NORVASC) 10 MG tablet Take 1 tablet (10 mg  total) by mouth daily. 90 tablet 0  . aspirin EC 81 MG tablet Take 1 tablet (81 mg total) by mouth daily. 90 tablet 3  . Calcium Carbonate-Vitamin D (CALCIUM-VITAMIN D) 500-200 MG-UNIT tablet Take 1 tablet by mouth daily.    . cholecalciferol (VITAMIN D) 1000 units tablet Take 2,000 Units by mouth daily.    . clindamycin (CLEOCIN) 150 MG capsule Take 150 mg by mouth as needed.    . DULoxetine (CYMBALTA) 60 MG capsule Take 1 capsule (60 mg total) by mouth 2 (two) times daily. 180 capsule 2  .  etanercept (ENBREL SURECLICK) 50 MG/ML injection Inject 50 mg into the skin once a week.    . levothyroxine (SYNTHROID, LEVOTHROID) 125 MCG tablet Take 1 tablet (125 mcg total) by mouth 2 (two) times daily. 180 tablet 0  . losartan (COZAAR) 50 MG tablet Take 1 tablet (50 mg total) by mouth daily. 90 tablet 3  . Multiple Vitamin (MULTIVITAMIN) capsule Take 1 capsule by mouth daily.    . rosuvastatin (CRESTOR) 20 MG tablet Take 1 tablet (20 mg total) by mouth daily. 90 tablet 3  . sildenafil (VIAGRA) 50 MG tablet Take 1 tablet (50 mg total) by mouth daily as needed for erectile dysfunction. 10 tablet 10  . zolpidem (AMBIEN) 10 MG tablet Take 1 tablet (10 mg total) by mouth at bedtime as needed for sleep. 30 tablet 2   No current facility-administered medications for this encounter.     Allergies:   Lisinopril; Amlodipine; and Penicillins   Social History:  The patient  reports that he has never smoked. He has never used smokeless tobacco. He reports current alcohol use of about 8.0 - 10.0 standard drinks of alcohol per week. He reports that he does not use drugs.   Family History:  The patient's family history includes Cancer in his mother; Diabetes in his sister; Hypertension in his father and mother.   ROS:  Please see the history of present illness.   All other systems are personally reviewed and negative.   Exam:  [facility]) Lungs: Normal respiratory effort with conversation.  Neuro: Alert & oriented x 3.  Mild pitting ankle edema by report  Recent Labs: 03/17/2018: ALT 34; BUN 18; Creatinine 1.4; Hemoglobin 14.6; Platelets 244; Potassium 4.0; Sodium 142  Personally reviewed   Wt Readings from Last 3 Encounters:  03/29/18 105.6 kg (232 lb 12.8 oz)  10/30/17 103.3 kg (227 lb 12 oz)  10/08/17 105.5 kg (232 lb 9.6 oz)      ASSESSMENT AND PLAN:  1. BLE edema and rash -  Due to amlodipine. Resolved.  2. HTN, uncontrolled - Unable to tolerate amlodipine or lisinopril. Added  both to allergy list. Will stop amlodipine - Tolerating losartan 50mg  daily well  (aware of10% cross-over risk of angioedema) - BP improved with exercise, diet and weight loss changes - Continue to follow home BPs. Goal SBP < 135 (now 130-low 140s) 3. Cardiac risk factor profile - Calcium score 11/2015 = 0 but with mild soft plaque.  - Continue aggressive Risk Factor modification.  - Exercises 40-45 minutes 4-5 a week.  - Continue ASA and Crestor. No change.  4. Hyperlipidemia with metabolic syndrome - Continue exercise and Crestor - LDL 45 10/21/2017.   - will recheck when COVID restrictions lifted 5. OSA - Continue CPAP 6. Ab bruit  - Aortic US 12/2015 unremarkable. No change.  7. Dilated aortic root (4.1 cm) on cardiac CT 11/2015 - Aortic root normal  on echo 10/2017 - repeat echo later this year  F/u visit 3-4 months   COVID screen The patient does not have any symptoms that suggest any further testing/ screening at this time.  Social distancing reinforced today.  Patient Risk: After full review of this patients clinical status, I feel that they are at moderate risk for cardiac decompensation at this time.  Today, I have spent 13 minutes with the patient with telehealth technology discussing   Signed,  Glori Bickers, MD  11:31 AM   [facility] [address] Heart and [place] [address] [phone] (office) [phone] (fax)

## 2018-11-09 DIAGNOSIS — G4733 Obstructive sleep apnea (adult) (pediatric): Secondary | ICD-10-CM | POA: Diagnosis not present

## 2018-11-25 DIAGNOSIS — G4733 Obstructive sleep apnea (adult) (pediatric): Secondary | ICD-10-CM | POA: Diagnosis not present

## 2018-11-29 DIAGNOSIS — R0902 Hypoxemia: Secondary | ICD-10-CM | POA: Diagnosis not present

## 2018-11-29 DIAGNOSIS — G4733 Obstructive sleep apnea (adult) (pediatric): Secondary | ICD-10-CM | POA: Diagnosis not present

## 2018-12-10 DIAGNOSIS — G4733 Obstructive sleep apnea (adult) (pediatric): Secondary | ICD-10-CM | POA: Diagnosis not present

## 2018-12-29 DIAGNOSIS — R0902 Hypoxemia: Secondary | ICD-10-CM | POA: Diagnosis not present

## 2018-12-29 DIAGNOSIS — G4733 Obstructive sleep apnea (adult) (pediatric): Secondary | ICD-10-CM | POA: Diagnosis not present

## 2019-01-01 ENCOUNTER — Other Ambulatory Visit: Payer: Self-pay | Admitting: Family Medicine

## 2019-01-02 ENCOUNTER — Other Ambulatory Visit: Payer: Self-pay | Admitting: Family Medicine

## 2019-01-03 ENCOUNTER — Other Ambulatory Visit: Payer: Self-pay | Admitting: *Deleted

## 2019-01-03 ENCOUNTER — Other Ambulatory Visit: Payer: Self-pay | Admitting: Family Medicine

## 2019-01-03 MED ORDER — LEVOTHYROXINE SODIUM 125 MCG PO TABS: 125.0000 ug | ORAL_TABLET | Freq: Two times a day (BID) | ORAL | 0 refills | Status: DC

## 2019-01-03 NOTE — Telephone Encounter (Signed)
Last OV 10/08/17 Next OV 01/07/19  Forwarding to Dr. Yong Channel, pt has not been seen in over a year.

## 2019-01-03 NOTE — Telephone Encounter (Signed)
Spoke to pt, told him need to schedule appt and have blood work for Thyroid done. Pt verbalized understanding. Appt scheduled for 7/17 with Dr. Yong Channel, Rx sent for 30 day supply only to local pharmacy. Pt verbalized understanding.

## 2019-01-03 NOTE — Telephone Encounter (Signed)
Give 1 month refill but needs office visit scheduled

## 2019-01-03 NOTE — Telephone Encounter (Signed)
REFILL levothyroxine (SYNTHROID, LEVOTHROID) 125 MCG tablet  PHARMACY [facility] [place], [place] [address]  [phone]

## 2019-01-03 NOTE — Telephone Encounter (Signed)
See note

## 2019-01-04 MED ORDER — LEVOTHYROXINE SODIUM 125 MCG PO TABS: 125.0000 ug | ORAL_TABLET | Freq: Two times a day (BID) | ORAL | 0 refills | Status: DC

## 2019-01-07 ENCOUNTER — Encounter: Payer: Self-pay | Admitting: Family Medicine

## 2019-01-07 ENCOUNTER — Other Ambulatory Visit: Payer: Self-pay

## 2019-01-07 ENCOUNTER — Ambulatory Visit (INDEPENDENT_AMBULATORY_CARE_PROVIDER_SITE_OTHER): Payer: BC Managed Care – PPO | Admitting: Family Medicine

## 2019-01-07 VITALS — BP 122/80 | HR 84 | Temp 98.2°F | Ht 72.75 in | Wt 223.4 lb

## 2019-01-07 DIAGNOSIS — Z0000 Encounter for general adult medical examination without abnormal findings: Secondary | ICD-10-CM | POA: Diagnosis not present

## 2019-01-07 DIAGNOSIS — E039 Hypothyroidism, unspecified: Secondary | ICD-10-CM

## 2019-01-07 DIAGNOSIS — L4050 Arthropathic psoriasis, unspecified: Secondary | ICD-10-CM

## 2019-01-07 DIAGNOSIS — I10 Essential (primary) hypertension: Secondary | ICD-10-CM

## 2019-01-07 DIAGNOSIS — D849 Immunodeficiency, unspecified: Secondary | ICD-10-CM

## 2019-01-07 DIAGNOSIS — Z125 Encounter for screening for malignant neoplasm of prostate: Secondary | ICD-10-CM

## 2019-01-07 DIAGNOSIS — F411 Generalized anxiety disorder: Secondary | ICD-10-CM

## 2019-01-07 DIAGNOSIS — D899 Disorder involving the immune mechanism, unspecified: Secondary | ICD-10-CM

## 2019-01-07 DIAGNOSIS — E782 Mixed hyperlipidemia: Secondary | ICD-10-CM

## 2019-01-07 DIAGNOSIS — E663 Overweight: Secondary | ICD-10-CM

## 2019-01-07 DIAGNOSIS — I77810 Thoracic aortic ectasia: Secondary | ICD-10-CM

## 2019-01-07 LAB — COMPREHENSIVE METABOLIC PANEL
ALT: 31 U/L (ref 0–53)
AST: 25 U/L (ref 0–37)
Albumin: 4.8 g/dL (ref 3.5–5.2)
Alkaline Phosphatase: 66 U/L (ref 39–117)
BUN: 27 mg/dL — ABNORMAL HIGH (ref 6–23)
CO2: 28 mEq/L (ref 19–32)
Calcium: 9.1 mg/dL (ref 8.4–10.5)
Chloride: 101 mEq/L (ref 96–112)
Creatinine, Ser: 1.14 mg/dL (ref 0.40–1.50)
GFR: 66.44 mL/min (ref >60.00)
Glucose, Bld: 107 mg/dL — ABNORMAL HIGH (ref 70–99)
Potassium: 4.4 mEq/L (ref 3.5–5.1)
Sodium: 138 mEq/L (ref 135–145)
Total Bilirubin: 0.7 mg/dL (ref 0.2–1.2)
Total Protein: 7.1 g/dL (ref 6.0–8.3)

## 2019-01-07 LAB — CBC
HCT: 45.1 % (ref 39.0–52.0)
Hemoglobin: 15.3 g/dL (ref 13.0–17.0)
MCHC: 33.8 g/dL (ref 30.0–36.0)
MCV: 90.4 fl (ref 78.0–100.0)
Platelets: 238 10*3/uL (ref 150.0–400.0)
RBC: 4.99 Mil/uL (ref 4.22–5.81)
RDW: 13.5 % (ref 11.5–15.5)
WBC: 5.6 10*3/uL (ref 4.0–10.5)

## 2019-01-07 LAB — LIPID PANEL
Cholesterol: 142 mg/dL (ref 0–200)
HDL: 57.7 mg/dL (ref >39.00)
LDL Cholesterol: 63 mg/dL (ref 0–99)
NonHDL: 83.99
Total CHOL/HDL Ratio: 2
Triglycerides: 104 mg/dL (ref 0.0–149.0)
VLDL: 20.8 mg/dL (ref 0.0–40.0)

## 2019-01-07 LAB — PSA: PSA: 1.41 ng/mL (ref 0.10–4.00)

## 2019-01-07 LAB — TSH: TSH: 0.32 u[IU]/mL — ABNORMAL LOW (ref 0.35–4.50)

## 2019-01-07 MED ORDER — LEVOTHYROXINE SODIUM 125 MCG PO TABS: 125.0000 ug | ORAL_TABLET | Freq: Two times a day (BID) | ORAL | 3 refills | Status: DC

## 2019-01-07 MED ORDER — DULOXETINE HCL 60 MG PO CPEP: 60.0000 mg | ORAL_CAPSULE | Freq: Two times a day (BID) | ORAL | 3 refills | Status: DC

## 2019-01-07 MED ORDER — ROSUVASTATIN CALCIUM 20 MG PO TABS: 20.0000 mg | ORAL_TABLET | Freq: Every day | ORAL | 3 refills | Status: DC

## 2019-01-07 MED ORDER — LOSARTAN POTASSIUM 50 MG PO TABS: 50.0000 mg | ORAL_TABLET | Freq: Every day | ORAL | 3 refills | Status: DC

## 2019-01-07 NOTE — Progress Notes (Addendum)
Phone: [phone]   Subjective:  Patient presents today for their annual physical. Chief complaint-noted.   See problem oriented charting- ROS- full  review of systems was completed and negative except for: some anxiety. No chest pain or shortness of breath. No headache or blurry vision.   The following were reviewed and entered/updated in epic: Past Medical History:  Diagnosis Date  . Abdominal bruit 12/28/2015  . Arthritis   . Erectile dysfunction 02/17/2014   Viagra   . GAD (generalized anxiety disorder) 02/17/2014   Cymbalta 120mg  daily. Xanax use typically once a month maximum. Consider CBT if worsens (never had)   . Hyperlipemia 11/22/2015  . Hypertension   . Hypothyroidism 02/17/2014   S/p total thyroidectomy due to history thyroid cancer. Follows still at [facility] with endocrinologist.    . Immunosuppression ([place]) 02/17/2014   On Enbrel-no live vaccines. Response may be less to typical pneumovax, tdap.    . Insomnia 02/17/2014   Intermittent Ambien use when not at home. Typically sleeps well at home.    . OSA (obstructive sleep apnea) 03/29/2018   Mild with AHI 5.6/hr with nocturnal hypoxemia.  Now on CPAP.  [name] Psoriatic arthritis ([place]) 02/17/2014   Dr. Ouida Sills [facility]. On Enbrel. Discovered at time of knee replacement.    . Sciatica of left side 02/17/2014   Following [facility].  04/05/14  Neurosurgery Dr. Ellene Route who recommended discdecompression for herniated disc at L4-L5 and L5-s1   . Thyroid cancer ([place])    s/p removal, now hypothyroid  . Total knee replacement status 02/17/2014   Clindamycin prn for dental prophylaxis-prescribed by dentist.     Patient Active Problem List   Diagnosis Date Noted  . Dilated aortic root ([place]) 10/09/2017    Priority: High  . Immunosuppression ([place]) 02/17/2014    Priority: High  . Hyperlipemia 11/22/2015    Priority: Medium  . GAD (generalized anxiety disorder) 02/17/2014    Priority: Medium  . Psoriatic  arthritis ([place]) 02/17/2014    Priority: Medium  . Hypothyroidism 02/17/2014    Priority: Medium  . Insomnia 02/17/2014    Priority: Medium  . Essential hypertension, benign 02/17/2014    Priority: Medium  . Abdominal bruit 12/28/2015    Priority: Low  . Erectile dysfunction 02/17/2014    Priority: Low  . Total knee replacement status 02/17/2014    Priority: Low  . Sciatica of left side 02/17/2014    Priority: Low  . OSA (obstructive sleep apnea) 03/29/2018   Past Surgical History:  Procedure Laterality Date  . CERVICAL DISCECTOMY  2015  . KNEE SURGERY Right 1980-2011  . LUMBAR DISC SURGERY     l4-l5, l5-s1- ruptured disc. Dr. Ellene Route  . right foot Right 2005  . SHOULDER SURGERY Right 1983  . THYROIDECTOMY Bilateral 2000    Family History  Problem Relation Age of Onset  . Cancer Mother   . Hypertension Mother   . Hypertension Father   . Diabetes Sister     Medications- reviewed and updated Current Outpatient Medications  Medication Sig Dispense Refill  . ALPRAZolam (XANAX) 0.25 MG tablet Take 1 tablet (0.25 mg total) by mouth at bedtime as needed for anxiety. 30 tablet 0  . aspirin EC 81 MG tablet Take 1 tablet (81 mg total) by mouth daily. 90 tablet 3  . Calcium Carbonate-Vitamin D (CALCIUM-VITAMIN D) 500-200 MG-UNIT tablet Take 1 tablet by mouth daily.    . cholecalciferol (VITAMIN D) 1000 units tablet Take 2,000 Units by mouth daily.    [name]  clindamycin (CLEOCIN) 150 MG capsule Take 150 mg by mouth as needed.    . DULoxetine (CYMBALTA) 60 MG capsule Take 1 capsule (60 mg total) by mouth 2 (two) times daily. 180 capsule 3  . etanercept (ENBREL SURECLICK) 50 MG/ML injection Inject 50 mg into the skin once a week.    . levothyroxine (SYNTHROID) 125 MCG tablet Take 1 tablet (125 mcg total) by mouth 2 (two) times daily. 180 tablet 3  . losartan (COZAAR) 50 MG tablet Take 1 tablet (50 mg total) by mouth daily. 90 tablet 3  . Multiple Vitamin (MULTIVITAMIN) capsule Take 1  capsule by mouth daily.    . rosuvastatin (CRESTOR) 20 MG tablet Take 1 tablet (20 mg total) by mouth daily. 90 tablet 3  . sildenafil (VIAGRA) 50 MG tablet Take 1 tablet (50 mg total) by mouth daily as needed for erectile dysfunction. 10 tablet 10  . zolpidem (AMBIEN) 10 MG tablet Take 1 tablet (10 mg total) by mouth at bedtime as needed for sleep. 30 tablet 2   No current facility-administered medications for this visit.     Allergies-reviewed and updated Allergies  Allergen Reactions  . Lisinopril Swelling    angioedema  . Amlodipine Swelling    In legs  . Penicillins Rash    Social History   Social History Narrative   Married for 29 years in 2016. 3 kids-1 at [place], 1 at [place]--> now at [employer], 1 at [employer]- injury  Preventing from playing baseball.       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 No rebound or guarding.  Ext: no edema and 2+ PT pulses Skin: warm, dry Neuro: grossly normal, moves all extremities, PERRLA    Assessment and Plan  58 y.o. male presenting for annual physical.  Health Maintenance counseling: 1. Anticipatory guidance: Patient counseled regarding regular dental exams -q6 months, eye exams - q2 yearly,  avoiding smoking and second hand smoke , limiting alcohol  to 2 beverages per day .   2. Risk factor reduction:  Advised patient of need for regular exercise and diet rich and fruits and vegetables to reduce risk of heart attack and stroke. Exercise- 40-45 minutes a day of power walking- knees bother him beyond that. . Diet-really watching intake. 9 lbs down over last year.  Wt Readings from Last 3 Encounters:  01/07/19 223 lb 6.4 oz (101.3 kg)  03/29/18 232 lb 12.8 oz (105.6 kg)  10/30/17 227 lb 12 oz (103.3 kg)  3. Immunizations/screenings/ancillary studies- check with rheumatologist about shingrix and we will consider this net year for shingrix- defer for now with covid 19 Immunization History  Administered Date(s) Administered  . Influenza,inj,Quad PF,6+ Mos 07/23/2016  . Influenza-Unspecified 03/15/2018  . Tdap  02/17/2014  4. Prostate cancer screening- will do PSA today- defer rectal as long as psa trend low risk. Mild BPH in past- occasional nocturia  Lab Results  Component Value Date   PSA 1.41 01/07/2019   PSA 1.32 07/23/2016   PSA 1.59 02/15/2015   5. Colon cancer screening - 01/2014 with 10 year repeat 6. Skin cancer screening- Dr. [place] [facility]. advised regular sunscreen use. Denies worrisome, changing, or new skin lesions.  7. Never smoker  Status of chronic or acute concerns   #hypertension S: controlled on Losartan 50 mg daily. Took Amlodipine 10 mg in the past but d/c d/t LE swelling. Angioedema on lisinopril in past- cards has been ok with losartan. Checking BP at home, it has been fluctuating a lot recently. BP has bene running between 120-140. Tolerating Losartan well. Rarely adds salt to foods. Has lost 12 lbs over the past few months. Walks 45 minutes daily. Denies HA, dizziness, CP, SOB, visual changes.  A/P: Stable. Continue current medications.  - we discussed possible cross reactivity and angioedema with losartan- he has done well nad will watch closely  #hypothyroidism S: On thyroid medication-Levothyroxine 125 mcg  BID.  That dates back to time of his cancer and endocrinologist in [place]. They were ok with normal range tsh Lab Results  Component Value Date   TSH 0.46 10/21/2017   A/P: hopefully stable- update tsh today  #hyperlipidemia S:  controlled on Rosuvastatin 20 mg daily and Aspirin 81 mg. Coronary CT 11/2015 score of 0. Lab Results  Component Value Date   CHOL 126 10/21/2017   HDL 51.80 10/21/2017   LDLCALC 45 10/21/2017   LDLDIRECT 152.0 02/15/2015   TRIG 142.0 10/21/2017   CHOLHDL 2 10/21/2017   A/P: hopefully stable- update lipids todya  # GAD S:Taking Alprazolam 0.25 mg prn (has not used in 6 months), Duloxetine 60 mg BID.  A/P: reasonable gad7 score at 6 today- continue current rx   # ED S:Taking Sildenafil 50 mg prn.   A/P: Stable. Continue current medications.    # Insomnia S:Taking Zolpidem 10 mg qhs prn.  Has not used in 6-8 months. Uses ambien for sleep- but has not had to use frequently A/P: Stable. Continue current medications.    #dilated aortic root in the past- fortunately last echo looked good. Dad had aortic aneurysm so likely will continue to follow  #psoriatic arthritis/immunosuppressed status- [facility]. On [facility]brel.   Recommended follow up: 1 year physical  Future Appointments  Date Time Provider [place]  02/15/2019  9:20 AM Bensimhon, Shaune Pascal, MD [facility] None   Lab/Order associations:fasting    ICD-10-CM   1. Preventative health care  Z00.00 CBC    Comprehensive metabolic panel    Lipid panel    PSA    TSH  2. Hypothyroidism, unspecified type  E03.9 TSH  3. Essential hypertension, benign  I10   4. Psoriatic arthritis ([place])  L40.50   5. Immunosuppression (HCC)  D89.9   6. Dilated aortic root ([facility]) Chronic I77.810   7. Overweight  E66.3   8. GAD (generalized anxiety disorder)  F41.1   9. Mixed hyperlipidemia  E78.2 CBC    Comprehensive metabolic panel    Lipid panel  10. Screening for prostate  cancer  Z12.5 PSA    Meds ordered this encounter  Medications  . losartan (COZAAR) 50 MG tablet    Sig: Take 1 tablet (50 mg total) by mouth daily.  Dispense:  90 tablet    Refill:  3  . levothyroxine (SYNTHROID) 125 MCG tablet    Sig: Take 1 tablet (125 mcg total) by mouth 2 (two) times daily.    Dispense:  180 tablet    Refill:  3  . rosuvastatin (CRESTOR) 20 MG tablet    Sig: Take 1 tablet (20 mg total) by mouth daily.    Dispense:  90 tablet    Refill:  3  . DULoxetine (CYMBALTA) 60 MG capsule    Sig: Take 1 capsule (60 mg total) by mouth 2 (two) times daily.    Dispense:  180 capsule    Refill:  3    Return precautions advised.  Garret Reddish, MD

## 2019-01-07 NOTE — Patient Instructions (Addendum)
Please stop by lab before you go If you do not have mychart- we will call you about results within 5 business days of [place] receiving them.  If you have mychart- we will send your results within 3 business days of [place] receiving them.  If abnormal or we want to clarify a result, we will call or mychart you to make sure you receive the message.  If you have questions or concerns or don't hear within 5-7 days, please send [place] a message or call us.   congrats on your weight loss and regular exercise! Keep up the great work

## 2019-01-10 ENCOUNTER — Other Ambulatory Visit: Payer: Self-pay | Admitting: Physical Therapy

## 2019-01-10 DIAGNOSIS — E039 Hypothyroidism, unspecified: Secondary | ICD-10-CM

## 2019-01-29 DIAGNOSIS — R0902 Hypoxemia: Secondary | ICD-10-CM | POA: Diagnosis not present

## 2019-01-29 DIAGNOSIS — G4733 Obstructive sleep apnea (adult) (pediatric): Secondary | ICD-10-CM | POA: Diagnosis not present

## 2019-02-15 ENCOUNTER — Ambulatory Visit (HOSPITAL_COMMUNITY)
Admission: RE | Admit: 2019-02-15 | Discharge: 2019-02-15 | Disposition: A | Discharge status: Home / Self Care | Payer: BC Managed Care – PPO | Source: Ambulatory Visit | Attending: Internal Medicine | Admitting: Internal Medicine

## 2019-02-15 ENCOUNTER — Encounter (HOSPITAL_COMMUNITY): Payer: Self-pay | Admitting: Internal Medicine

## 2019-02-15 ENCOUNTER — Other Ambulatory Visit: Payer: Self-pay

## 2019-02-15 VITALS — BP 150/90 | HR 98 | Wt 229.0 lb

## 2019-02-15 DIAGNOSIS — Z8249 Family history of ischemic heart disease and other diseases of the circulatory system: Secondary | ICD-10-CM | POA: Insufficient documentation

## 2019-02-15 DIAGNOSIS — R0683 Snoring: Secondary | ICD-10-CM | POA: Diagnosis not present

## 2019-02-15 DIAGNOSIS — Z880 Allergy status to penicillin: Secondary | ICD-10-CM | POA: Insufficient documentation

## 2019-02-15 DIAGNOSIS — I10 Essential (primary) hypertension: Secondary | ICD-10-CM | POA: Insufficient documentation

## 2019-02-15 DIAGNOSIS — E8881 Metabolic syndrome: Secondary | ICD-10-CM | POA: Diagnosis not present

## 2019-02-15 DIAGNOSIS — Z888 Allergy status to other drugs, medicaments and biological substances status: Secondary | ICD-10-CM | POA: Insufficient documentation

## 2019-02-15 DIAGNOSIS — M1990 Unspecified osteoarthritis, unspecified site: Secondary | ICD-10-CM | POA: Insufficient documentation

## 2019-02-15 DIAGNOSIS — Z79890 Hormone replacement therapy (postmenopausal): Secondary | ICD-10-CM | POA: Diagnosis not present

## 2019-02-15 DIAGNOSIS — Z833 Family history of diabetes mellitus: Secondary | ICD-10-CM | POA: Diagnosis not present

## 2019-02-15 DIAGNOSIS — Z7982 Long term (current) use of aspirin: Secondary | ICD-10-CM | POA: Insufficient documentation

## 2019-02-15 DIAGNOSIS — L4050 Arthropathic psoriasis, unspecified: Secondary | ICD-10-CM | POA: Diagnosis not present

## 2019-02-15 DIAGNOSIS — G4700 Insomnia, unspecified: Secondary | ICD-10-CM | POA: Diagnosis not present

## 2019-02-15 DIAGNOSIS — R0989 Other specified symptoms and signs involving the circulatory and respiratory systems: Secondary | ICD-10-CM | POA: Diagnosis not present

## 2019-02-15 DIAGNOSIS — E785 Hyperlipidemia, unspecified: Secondary | ICD-10-CM | POA: Diagnosis not present

## 2019-02-15 DIAGNOSIS — G4733 Obstructive sleep apnea (adult) (pediatric): Secondary | ICD-10-CM

## 2019-02-15 DIAGNOSIS — Z96659 Presence of unspecified artificial knee joint: Secondary | ICD-10-CM | POA: Diagnosis not present

## 2019-02-15 DIAGNOSIS — F411 Generalized anxiety disorder: Secondary | ICD-10-CM | POA: Insufficient documentation

## 2019-02-15 DIAGNOSIS — Z79899 Other long term (current) drug therapy: Secondary | ICD-10-CM | POA: Insufficient documentation

## 2019-02-15 DIAGNOSIS — E782 Mixed hyperlipidemia: Secondary | ICD-10-CM | POA: Diagnosis not present

## 2019-02-15 DIAGNOSIS — E890 Postprocedural hypothyroidism: Secondary | ICD-10-CM | POA: Insufficient documentation

## 2019-02-15 DIAGNOSIS — R0789 Other chest pain: Secondary | ICD-10-CM

## 2019-02-15 DIAGNOSIS — Z85850 Personal history of malignant neoplasm of thyroid: Secondary | ICD-10-CM | POA: Diagnosis not present

## 2019-02-15 DIAGNOSIS — R0602 Shortness of breath: Secondary | ICD-10-CM

## 2019-02-15 DIAGNOSIS — I77810 Thoracic aortic ectasia: Secondary | ICD-10-CM | POA: Diagnosis not present

## 2019-02-15 NOTE — Addendum Note (Signed)
Encounter addended by: Harvie Junior, CMA on: 02/15/2019 9:52 AM  Actions taken: Visit diagnoses modified, Diagnosis association updated, Order list changed, Clinical Note Signed

## 2019-02-15 NOTE — Progress Notes (Signed)
Cardiology Clinic Note   ID:  Jared, Martin 1961/01/01, MRN [id number]  Location: Home  Provider location: [place] Type of Visit: Established patient, etc  PCP:  Marin Olp, MD  Cardiologist:  No primary care provider on file. Primary HF: Dr Haroldine Laws  Chief Complaint: HTN   History of Present Illness: Jared Martin is a 58 y.o. 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   He had angioedema in January 2019 on Lisinopril and was hospitalized in [place]. He was placed on amlodipine and had BLE edema with rash. I switched him to losartan 50 daily last month (with acknowledgement of 10% cross-reactivity with ACE for angioedema). Tolerating losartan well. Edema resolved after stopping amlodipine.  Exercising 5-6 days week walking 40-50 mins at good pace. No SOB. Mild chest tightness when he is really pushing. Bought a BP cuff 120/80s. Weight stable. Wearing CPAP.   Lipids 01/07/19 TC 142 HDL 58 LDL 63 TG 104   Past Medical History:  Diagnosis Date  . Abdominal bruit 12/28/2015  . Arthritis   . Erectile dysfunction 02/17/2014   Viagra   . GAD (generalized anxiety disorder) 02/17/2014   Cymbalta 120mg  daily. Xanax use typically once a month maximum. Consider CBT if worsens (never had)   . Hyperlipemia 11/22/2015  . Hypertension   . Hypothyroidism 02/17/2014   S/p total thyroidectomy due to history thyroid cancer. Follows still at [facility] with endocrinologist.    . Immunosuppression ([place]) 02/17/2014   On Enbrel-no live vaccines. Response may be less to typical pneumovax, tdap.    . Insomnia 02/17/2014   Intermittent Ambien use when not at home. Typically sleeps well at home.    . OSA (obstructive sleep apnea) 03/29/2018   Mild with AHI 5.6/hr with nocturnal hypoxemia.  Now on CPAP.  [name] Psoriatic arthritis ([place]) 02/17/2014    Dr. Ouida Sills [facility]. On Enbrel. Discovered at time of knee replacement.    . Sciatica of left side 02/17/2014   Following [facility].  04/05/14  Neurosurgery Dr. Ellene Route who recommended discdecompression for herniated disc at L4-L5 and L5-s1   . Thyroid cancer ([place])    s/p removal, now hypothyroid  . Total knee replacement status 02/17/2014   Clindamycin prn for dental prophylaxis-prescribed by dentist.     Past Surgical History:  Procedure Laterality Date  . CERVICAL DISCECTOMY  2015  . KNEE SURGERY Right 1980-2011  . LUMBAR DISC SURGERY     l4-l5, l5-s1- ruptured disc. Dr. Ellene Route  . right foot Right 2005  . SHOULDER SURGERY Right 1983  . THYROIDECTOMY Bilateral 2000     Current Outpatient Medications  Medication Sig Dispense Refill  . ALPRAZolam (XANAX) 0.25 MG tablet Take 1 tablet (0.25 mg total) by mouth at bedtime as needed for anxiety. 30 tablet 0  . aspirin EC 81 MG tablet Take 1 tablet (81 mg total) by mouth daily. 90 tablet 3  . Calcium Carbonate-Vitamin D (CALCIUM-VITAMIN D) 500-200 MG-UNIT tablet Take 1 tablet by mouth daily.    . cholecalciferol (VITAMIN D) 1000 units tablet Take 2,000 Units by mouth daily.    . clindamycin (CLEOCIN) 150 MG capsule Take 150 mg by mouth as needed.    . DULoxetine (CYMBALTA) 60 MG capsule Take 1 capsule (60 mg total) by mouth 2 (two) times daily. 180 capsule  3  . etanercept (ENBREL SURECLICK) 50 MG/ML injection Inject 50 mg into the skin once a week.    . levothyroxine (SYNTHROID) 125 MCG tablet Take 1 tablet (125 mcg total) by mouth 2 (two) times daily. 180 tablet 3  . losartan (COZAAR) 50 MG tablet Take 1 tablet (50 mg total) by mouth daily. 90 tablet 3  . Multiple Vitamin (MULTIVITAMIN) capsule Take 1 capsule by mouth daily.    . rosuvastatin (CRESTOR) 20 MG tablet Take 1 tablet (20 mg total) by mouth daily. 90 tablet 3  . sildenafil (VIAGRA) 50 MG tablet Take 1 tablet (50 mg total) by mouth daily as needed  for erectile dysfunction. 10 tablet 10  . zolpidem (AMBIEN) 10 MG tablet Take 1 tablet (10 mg total) by mouth at bedtime as needed for sleep. 30 tablet 2   No current facility-administered medications for this encounter.     Allergies:   Lisinopril, Amlodipine, and Penicillins   Social History:  The patient  reports that he has never smoked. He has never used smokeless tobacco. He reports current alcohol use of about 8.0 - 10.0 standard drinks of alcohol per week. He reports that he does not use drugs.   Family History:  The patient's family history includes Cancer in his mother; Diabetes in his sister; Hypertension in his father and mother.   ROS:  Please see the history of present illness.   All other systems are personally reviewed and negative.   Exam:   General:  Well appearing. No resp difficulty HEENT: normal Neck: supple. no JVD. Carotids 2+ bilat; no bruits. No lymphadenopathy or thryomegaly appreciated. Cor: PMI nondisplaced. Regular rate & rhythm. No rubs, gallops or murmurs. Lungs: clear Abdomen: soft, nontender, nondistended. No hepatosplenomegaly. No bruits or masses. Good bowel sounds. Extremities: no cyanosis, clubbing, rash, edema Neuro: alert & orientedx3, cranial nerves grossly intact. moves all 4 extremities w/o difficulty. Affect pleasant   Recent Labs: 01/07/2019: ALT 31; BUN 27; Creatinine, Ser 1.14; Hemoglobin 15.3; Platelets 238.0; Potassium 4.4; Sodium 138; TSH 0.32  Personally reviewed   Wt Readings from Last 3 Encounters:  02/15/19 103.9 kg (229 lb)  01/07/19 101.3 kg (223 lb 6.4 oz)  03/29/18 105.6 kg (232 lb 12.8 oz)      ASSESSMENT AND PLAN:  1. HTN, controlled - Unable to tolerate amlodipine or lisinopril. Added both to allergy list.  - Tolerating losartan 50mg  daily well  (aware of10% cross-over risk of angioedema) - BP improved with exercise, diet and weight loss changes - Continue to follow home BPs. Goal SBP < 135 (now 120s) 2. Cardiac  risk factor profile - Calcium score 11/2015 = 0 but with mild soft plaque.  - Continue aggressive Risk Factor modification.  - Exercises 45-50 minutes days a week. Has mild chest tightness at peak exertion. Suspect just due to high-level exercise but given FHx and CRFs will get coronary CTA - Continue ASA and Crestor. No change.  3. Hyperlipidemia with metabolic syndrome - Continue exercise and Crestor - recent lipids look good 4. OSA - Continue CPAP 5. Ab bruit  - Aortic US 12/2015 unremarkable. No change.  6. Dilated aortic root (4.1 cm) on cardiac CT 11/2015 - Aortic root normal on echo 10/2017 - Will re-measure on cardiac CT  Glori Bickers, MD  9:32 AM   [facility] [place] [address] [phone] (office) [phone] (fax)

## 2019-02-15 NOTE — Patient Instructions (Signed)
Your provider requests you have a coronary CTA. (we will call you to schedule)  Follow up with Dr.Bensimhon in 1 year. (please call our office in July of 2021 to schedule your August 2021 appointment)

## 2019-02-18 DIAGNOSIS — L309 Dermatitis, unspecified: Secondary | ICD-10-CM | POA: Diagnosis not present

## 2019-02-18 DIAGNOSIS — L738 Other specified follicular disorders: Secondary | ICD-10-CM | POA: Diagnosis not present

## 2019-02-18 DIAGNOSIS — D225 Melanocytic nevi of trunk: Secondary | ICD-10-CM | POA: Diagnosis not present

## 2019-02-18 DIAGNOSIS — D2261 Melanocytic nevi of right upper limb, including shoulder: Secondary | ICD-10-CM | POA: Diagnosis not present

## 2019-02-22 ENCOUNTER — Telehealth: Payer: Self-pay | Admitting: Family Medicine

## 2019-02-22 NOTE — Telephone Encounter (Signed)
°  LAST APPOINTMENT DATE: 01/07/2019  NEXT APPOINTMENT DATE:@9 /07/2018  MEDICATION: levothyroxine (SYNTHROID) 125 MCG tablet  PHARMACY: [facility] AS HE WILL BE IN [place] AND NEEDS IT SENT TO A PHARMACY THERE  PATIENT IS OUT OF MEDICATION AND IS REQUESTING 30 DAY SUPPLY  *Let patient know to contact pharmacy at the end of the day to make sure medication is ready. *  * Please notify patient to allow 48-72 hours to process*  *Encourage patient to contact the pharmacy for refills or they can request refills through [place]:   LAST REFILL:  QTY:  REFILL DATE:    OTHER COMMENTS:    Okay for refill?  Please advise

## 2019-02-22 NOTE — Telephone Encounter (Signed)
FYI  Called pt to get the name of the preferred pharmacy to send Rx. Pt stated that he would like to wait before sending in the Rx bc he is having labs tomorrow. Pt will inform Dr. Yong Channel where he wants the medication to go after lab work in case any changes.

## 2019-02-23 ENCOUNTER — Encounter: Payer: Self-pay | Admitting: Family Medicine

## 2019-02-23 ENCOUNTER — Other Ambulatory Visit: Payer: Self-pay

## 2019-02-23 ENCOUNTER — Other Ambulatory Visit (INDEPENDENT_AMBULATORY_CARE_PROVIDER_SITE_OTHER): Payer: BC Managed Care – PPO

## 2019-02-23 DIAGNOSIS — G4733 Obstructive sleep apnea (adult) (pediatric): Secondary | ICD-10-CM | POA: Diagnosis not present

## 2019-02-23 DIAGNOSIS — E039 Hypothyroidism, unspecified: Secondary | ICD-10-CM

## 2019-02-23 LAB — TSH: TSH: 0.23 u[IU]/mL — ABNORMAL LOW (ref 0.35–4.50)

## 2019-02-23 MED ORDER — LEVOTHYROXINE SODIUM 125 MCG PO TABS: 125.0000 ug | ORAL_TABLET | Freq: Two times a day (BID) | ORAL | 3 refills | Status: DC

## 2019-02-23 NOTE — Telephone Encounter (Signed)
Addressed by phone when I called with lab results.

## 2019-03-01 DIAGNOSIS — G4733 Obstructive sleep apnea (adult) (pediatric): Secondary | ICD-10-CM | POA: Diagnosis not present

## 2019-03-01 DIAGNOSIS — R0902 Hypoxemia: Secondary | ICD-10-CM | POA: Diagnosis not present

## 2019-03-07 NOTE — Telephone Encounter (Signed)
Copied from [place] [id number]. Topic: Appointment Scheduling - Scheduling Inquiry for Clinic >> Mar 07, 2019 11:52 AM Oneta Rack wrote: Patient would like a flu shot on 04/07/2019 directly after his lab appointment at 8:15am, advised the patient nurse schedule doesn't open up until 9am. Patient would like to know if we can accomodates his needs due to not being able to wait until 9am , please advise

## 2019-03-08 ENCOUNTER — Telehealth: Payer: Self-pay

## 2019-03-08 NOTE — Telephone Encounter (Signed)
See telephone note.

## 2019-03-08 NOTE — Telephone Encounter (Signed)
Copied from [place] [id number]. Topic: Appointment Scheduling - Scheduling Inquiry for Clinic >> Mar 07, 2019 11:52 AM Oneta Rack wrote: Patient would like a flu shot on 04/07/2019 directly after his lab appointment at 8:15am, advised the patient nurse schedule doesn't open up until 9am. Patient would like to know if we can accomodates his needs due to not being able to wait until 9am , please advise

## 2019-03-08 NOTE — Telephone Encounter (Signed)
Please contact pt to schedule NUR visit for flu shot on day of lab visit. I will not be in the office to give FYI.

## 2019-03-08 NOTE — Telephone Encounter (Signed)
lvm for patient to let him know that he can come at the same time

## 2019-03-16 ENCOUNTER — Telehealth (HOSPITAL_COMMUNITY): Payer: Self-pay | Admitting: Vascular Surgery

## 2019-03-16 NOTE — Telephone Encounter (Signed)
Left pt detailed  message giving Cardiac CT appt 10/2 @ 10:30 am , asked pt to call back to confirm appt

## 2019-03-21 DIAGNOSIS — L4059 Other psoriatic arthropathy: Secondary | ICD-10-CM | POA: Diagnosis not present

## 2019-03-21 DIAGNOSIS — L409 Psoriasis, unspecified: Secondary | ICD-10-CM | POA: Diagnosis not present

## 2019-03-23 ENCOUNTER — Other Ambulatory Visit: Payer: Self-pay | Admitting: Emergency Medicine

## 2019-03-23 DIAGNOSIS — I712 Thoracic aortic aneurysm, without rupture, unspecified: Secondary | ICD-10-CM

## 2019-03-24 ENCOUNTER — Telehealth (HOSPITAL_COMMUNITY): Payer: Self-pay | Admitting: Emergency Medicine

## 2019-03-24 NOTE — Telephone Encounter (Signed)
Reaching out to patient to offer assistance regarding upcoming cardiac imaging study; pt verbalizes understanding of appt date/time, parking situation and where to check in, pre-test NPO status and medications ordered, and verified current allergies; name and call back number provided for further questions should they arise Ilena Dieckman RN Navigator Cardiac Imaging [facility] [phone] office [phone] cell 

## 2019-03-25 ENCOUNTER — Other Ambulatory Visit: Payer: Self-pay

## 2019-03-25 ENCOUNTER — Ambulatory Visit (HOSPITAL_COMMUNITY): Payer: BC Managed Care – PPO

## 2019-03-25 ENCOUNTER — Ambulatory Visit (HOSPITAL_COMMUNITY)
Admission: RE | Admit: 2019-03-25 | Discharge: 2019-03-25 | Disposition: A | Discharge status: Home / Self Care | Payer: BC Managed Care – PPO | Source: Ambulatory Visit | Attending: Internal Medicine | Admitting: Internal Medicine

## 2019-03-25 DIAGNOSIS — I10 Essential (primary) hypertension: Secondary | ICD-10-CM | POA: Diagnosis not present

## 2019-03-25 DIAGNOSIS — I712 Thoracic aortic aneurysm, without rupture, unspecified: Secondary | ICD-10-CM

## 2019-03-25 DIAGNOSIS — I700 Atherosclerosis of aorta: Secondary | ICD-10-CM | POA: Diagnosis not present

## 2019-03-25 DIAGNOSIS — I77810 Thoracic aortic ectasia: Secondary | ICD-10-CM | POA: Diagnosis not present

## 2019-03-25 MED ORDER — NITROGLYCERIN 0.4 MG SL SUBL
0.8000 mg | SUBLINGUAL_TABLET | Freq: Once | SUBLINGUAL | Status: AC
  Administered 2019-03-25: 0.8 mg via SUBLINGUAL
  Filled 2019-03-25: qty 25

## 2019-03-25 MED ORDER — IOHEXOL 350 MG/ML SOLN
80.0000 mL | Freq: Once | INTRAVENOUS | Status: AC | PRN
  Administered 2019-03-25: 80 mL via INTRAVENOUS

## 2019-03-25 MED ORDER — NITROGLYCERIN 0.4 MG SL SUBL
SUBLINGUAL_TABLET | SUBLINGUAL | Status: AC
  Filled 2019-03-25: qty 2

## 2019-03-31 DIAGNOSIS — R0902 Hypoxemia: Secondary | ICD-10-CM | POA: Diagnosis not present

## 2019-03-31 DIAGNOSIS — G4733 Obstructive sleep apnea (adult) (pediatric): Secondary | ICD-10-CM | POA: Diagnosis not present

## 2019-04-07 ENCOUNTER — Ambulatory Visit (INDEPENDENT_AMBULATORY_CARE_PROVIDER_SITE_OTHER): Payer: BC Managed Care – PPO

## 2019-04-07 ENCOUNTER — Encounter: Payer: Self-pay | Admitting: Family Medicine

## 2019-04-07 ENCOUNTER — Other Ambulatory Visit: Payer: Self-pay

## 2019-04-07 ENCOUNTER — Other Ambulatory Visit (INDEPENDENT_AMBULATORY_CARE_PROVIDER_SITE_OTHER): Payer: BC Managed Care – PPO

## 2019-04-07 DIAGNOSIS — Z23 Encounter for immunization: Secondary | ICD-10-CM | POA: Diagnosis not present

## 2019-04-07 DIAGNOSIS — E039 Hypothyroidism, unspecified: Secondary | ICD-10-CM | POA: Diagnosis not present

## 2019-04-07 LAB — TSH: TSH: 0.49 u[IU]/mL (ref 0.35–4.50)

## 2019-05-01 DIAGNOSIS — G4733 Obstructive sleep apnea (adult) (pediatric): Secondary | ICD-10-CM | POA: Diagnosis not present

## 2019-05-01 DIAGNOSIS — R0902 Hypoxemia: Secondary | ICD-10-CM | POA: Diagnosis not present

## 2019-05-31 DIAGNOSIS — G4733 Obstructive sleep apnea (adult) (pediatric): Secondary | ICD-10-CM | POA: Diagnosis not present

## 2019-05-31 DIAGNOSIS — R0902 Hypoxemia: Secondary | ICD-10-CM | POA: Diagnosis not present

## 2019-07-01 DIAGNOSIS — G4733 Obstructive sleep apnea (adult) (pediatric): Secondary | ICD-10-CM | POA: Diagnosis not present

## 2019-07-01 DIAGNOSIS — R0902 Hypoxemia: Secondary | ICD-10-CM | POA: Diagnosis not present

## 2019-08-01 DIAGNOSIS — R0902 Hypoxemia: Secondary | ICD-10-CM | POA: Diagnosis not present

## 2019-08-01 DIAGNOSIS — G4733 Obstructive sleep apnea (adult) (pediatric): Secondary | ICD-10-CM | POA: Diagnosis not present

## 2019-08-03 DIAGNOSIS — G4733 Obstructive sleep apnea (adult) (pediatric): Secondary | ICD-10-CM | POA: Diagnosis not present

## 2019-08-29 DIAGNOSIS — R0902 Hypoxemia: Secondary | ICD-10-CM | POA: Diagnosis not present

## 2019-08-29 DIAGNOSIS — G4733 Obstructive sleep apnea (adult) (pediatric): Secondary | ICD-10-CM | POA: Diagnosis not present

## 2019-09-12 DIAGNOSIS — H52203 Unspecified astigmatism, bilateral: Secondary | ICD-10-CM | POA: Diagnosis not present

## 2019-09-12 DIAGNOSIS — H35363 Drusen (degenerative) of macula, bilateral: Secondary | ICD-10-CM | POA: Diagnosis not present

## 2019-09-12 DIAGNOSIS — H2513 Age-related nuclear cataract, bilateral: Secondary | ICD-10-CM | POA: Diagnosis not present

## 2019-09-12 DIAGNOSIS — H524 Presbyopia: Secondary | ICD-10-CM | POA: Diagnosis not present

## 2019-09-27 DIAGNOSIS — L4059 Other psoriatic arthropathy: Secondary | ICD-10-CM | POA: Diagnosis not present

## 2019-09-27 DIAGNOSIS — L409 Psoriasis, unspecified: Secondary | ICD-10-CM | POA: Diagnosis not present

## 2019-09-29 DIAGNOSIS — R0902 Hypoxemia: Secondary | ICD-10-CM | POA: Diagnosis not present

## 2019-09-29 DIAGNOSIS — G4733 Obstructive sleep apnea (adult) (pediatric): Secondary | ICD-10-CM | POA: Diagnosis not present

## 2019-10-17 ENCOUNTER — Other Ambulatory Visit: Payer: Self-pay

## 2019-10-17 MED ORDER — LEVOTHYROXINE SODIUM 125 MCG PO TABS: 125.0000 ug | ORAL_TABLET | Freq: Two times a day (BID) | ORAL | 3 refills | Status: DC

## 2019-10-17 MED ORDER — ROSUVASTATIN CALCIUM 20 MG PO TABS: 20.0000 mg | ORAL_TABLET | Freq: Every day | ORAL | 3 refills | Status: DC

## 2019-10-17 MED ORDER — LOSARTAN POTASSIUM 50 MG PO TABS: 50.0000 mg | ORAL_TABLET | Freq: Every day | ORAL | 3 refills | Status: DC

## 2019-10-17 MED ORDER — DULOXETINE HCL 60 MG PO CPEP: 60.0000 mg | ORAL_CAPSULE | Freq: Two times a day (BID) | ORAL | 3 refills | Status: DC

## 2019-10-20 ENCOUNTER — Telehealth: Payer: Self-pay | Admitting: Family Medicine

## 2019-10-20 NOTE — Telephone Encounter (Signed)
Patient state he has changed pharmacies.   Needs to remove [facility]   [facility]  Patient is also requesting all of [facility]

## 2019-10-25 ENCOUNTER — Encounter: Payer: Self-pay | Admitting: Family Medicine

## 2019-10-25 ENCOUNTER — Other Ambulatory Visit: Payer: Self-pay

## 2019-10-25 MED ORDER — LOSARTAN POTASSIUM 50 MG PO TABS: 50.0000 mg | ORAL_TABLET | Freq: Every day | ORAL | 3 refills | Status: DC

## 2019-10-25 MED ORDER — DULOXETINE HCL 60 MG PO CPEP: 60.0000 mg | ORAL_CAPSULE | Freq: Two times a day (BID) | ORAL | 3 refills | Status: DC

## 2019-10-25 MED ORDER — ZOLPIDEM TARTRATE 10 MG PO TABS: 10.0000 mg | ORAL_TABLET | Freq: Every evening | ORAL | 2 refills | Status: DC | PRN

## 2019-10-25 MED ORDER — ROSUVASTATIN CALCIUM 20 MG PO TABS: 20.0000 mg | ORAL_TABLET | Freq: Every day | ORAL | 3 refills | Status: AC

## 2019-10-25 MED ORDER — ENBREL SURECLICK 50 MG/ML ~~LOC~~ SOAJ: 50.0000 mg | SUBCUTANEOUS | 3 refills | Status: DC

## 2019-10-25 MED ORDER — ALPRAZOLAM 0.25 MG PO TABS: 0.2500 mg | ORAL_TABLET | Freq: Two times a day (BID) | ORAL | 0 refills | Status: DC | PRN

## 2019-10-25 MED ORDER — SILDENAFIL CITRATE 50 MG PO TABS: 50.0000 mg | ORAL_TABLET | Freq: Every day | ORAL | 10 refills | Status: AC | PRN

## 2019-10-25 MED ORDER — LEVOTHYROXINE SODIUM 125 MCG PO TABS: 125.0000 ug | ORAL_TABLET | Freq: Two times a day (BID) | ORAL | 3 refills | Status: AC

## 2019-10-25 NOTE — Progress Notes (Signed)
Called and lm tcb, yes [facility] is the pharmacy he requested everything to be switched over to through [place] due to insurance.

## 2019-10-25 NOTE — Progress Notes (Signed)
Pt returned call and he will cb when he looks at his schedule to make his CPE appointment.

## 2019-10-25 NOTE — Progress Notes (Signed)
He needs to be scheduled for his annual visit.  I am willing to refill this-I made some modifications and pended it.  [facility] was [facility]-just wanted to make sure this was requested pharmacy before sending in

## 2019-10-25 NOTE — Progress Notes (Signed)
I filled this medication

## 2019-10-26 ENCOUNTER — Other Ambulatory Visit: Payer: Self-pay | Admitting: Family Medicine

## 2019-10-27 NOTE — Telephone Encounter (Signed)
Unclear what is going on here-if we refilled this 2 days ago obviously need a refill? New prescription

## 2019-10-27 NOTE — Telephone Encounter (Signed)
Last refill: 10/25/19 #30, 0 Last OV: 01/07/19 dx. Preventative care

## 2019-10-29 DIAGNOSIS — R0902 Hypoxemia: Secondary | ICD-10-CM | POA: Diagnosis not present

## 2019-10-29 DIAGNOSIS — G4733 Obstructive sleep apnea (adult) (pediatric): Secondary | ICD-10-CM | POA: Diagnosis not present

## 2019-11-01 DIAGNOSIS — G4733 Obstructive sleep apnea (adult) (pediatric): Secondary | ICD-10-CM | POA: Diagnosis not present

## 2019-11-01 NOTE — Progress Notes (Signed)
Virtual Visit via Telephone Note   This visit type was conducted due to national recommendations for restrictions regarding the COVID-19 Pandemic (e.g. social distancing) in an effort to limit this patient's exposure and mitigate transmission in our community.  Due to his co-morbid illnesses, this patient is at least at moderate risk for complications without adequate follow up.  This format is felt to be most appropriate for this patient at this time.  The patient did not have access to video technology/had technical difficulties with video requiring transitioning to audio format only (telephone).  All issues noted in this document were discussed and addressed.  No physical exam could be performed with this format.  Please refer to the patient's chart for his  consent to telehealth for [facility].   Evaluation Performed:  Follow-up visit  This visit type was conducted due to national recommendations for restrictions regarding the COVID-19 Pandemic (e.g. social distancing).  This format is felt to be most appropriate for this patient at this time.  All issues noted in this document were discussed and addressed.  No physical exam was performed (except for noted visual exam findings with Video Visits).  Please refer to the patient's chart (MyChart message for video visits and phone note for telephone visits) for the patient's consent to telehealth for [facility].  Date:  11/02/2019   ID:  Jared Martin, DOB 08/20/1960, MRN [id number]  Patient Location:  Home  Provider location:   [place]  PCP:  Marin Olp, MD  Cardiologist:  Glori Bickers, MD Sleep Medicine:  Fransico Him, MD Electrophysiologist:  None   Chief Complaint:  OSA  History of Present Illness:    Jared Martin is a 59 y.o. male who presents via audio/video conferencing for a telehealth visit today.    Jared Martin is a 59 y.o. male with a hx of hypertension, hyperlipidemia, hyperlipidemia with  metabolic syndrome and snoring who was referred by Dr. Haroldine Laws for evaluation for possible sleep apnea.  He underwent home sleep study which showed mild obstructive sleep apnea with an AHI of 5.6/h and nocturnal hypoxemia with O2 saturations less than 89% for 30 minutes out of total sleep time.  Insurance denied in lab study and he underwent CPAP auto titration at home.  He is also using supplemental O2 with his PAP device.   He is doing well with his CPAP device and thinks that he has gotten used to it.  He tolerates the mask and feels the pressure is adequate.  Since going on CPAP he feels rested in the am and has no significant daytime sleepiness.  He denies any significant mouth or nasal dryness or nasal congestion.  He does not think that he snores.    The patient does not have symptoms concerning for COVID-19 infection (fever, chills, cough, or new shortness of breath).   Prior CV studies:   The following studies were reviewed today:  PAP compliance download  Past Medical History:  Diagnosis Date  . Abdominal bruit 12/28/2015  . Arthritis   . Erectile dysfunction 02/17/2014   Viagra   . GAD (generalized anxiety disorder) 02/17/2014   Cymbalta 120mg  daily. Xanax use typically once a month maximum. Consider CBT if worsens (never had)   . Hyperlipemia 11/22/2015  . Hypertension   . Hypothyroidism 02/17/2014   S/p total thyroidectomy due to history thyroid cancer. Follows still at [facility] with endocrinologist.    . Immunosuppression ([place]) 02/17/2014   On Enbrel-no live vaccines. Response  may be less to typical pneumovax, tdap.    . Insomnia 02/17/2014   Intermittent Ambien use when not at home. Typically sleeps well at home.    . OSA (obstructive sleep apnea) 03/29/2018   Mild with AHI 5.6/hr with nocturnal hypoxemia.  Now on CPAP.  [name] Psoriatic arthritis ([place]) 02/17/2014   Dr. Ouida Sills [facility]. On Enbrel. Discovered at time of knee replacement.    . Sciatica of left  side 02/17/2014   Following [facility].  04/05/14  Neurosurgery Dr. Ellene Route who recommended discdecompression for herniated disc at L4-L5 and L5-s1   . Thyroid cancer ([place])    s/p removal, now hypothyroid  . Total knee replacement status 02/17/2014   Clindamycin prn for dental prophylaxis-prescribed by dentist.     Past Surgical History:  Procedure Laterality Date  . CERVICAL DISCECTOMY  2015  . KNEE SURGERY Right 1980-2011  . LUMBAR DISC SURGERY     l4-l5, l5-s1- ruptured disc. Dr. Ellene Route  . right foot Right 2005  . SHOULDER SURGERY Right 1983  . THYROIDECTOMY Bilateral 2000     Current Meds  Medication Sig  . ALPRAZolam (XANAX) 0.25 MG tablet Take 1 tablet (0.25 mg total) by mouth 2 (two) times daily as needed for anxiety. Do not drive 8 hours after taking. Separate by 8 hours from [place].  [name] aspirin EC 81 MG tablet Take 1 tablet (81 mg total) by mouth daily.  . Calcium Carbonate-Vitamin D (CALCIUM-VITAMIN D) 500-200 MG-UNIT tablet Take 1 tablet by mouth daily.  . cholecalciferol (VITAMIN D) 1000 units tablet Take 2,000 Units by mouth daily.  . clindamycin (CLEOCIN) 150 MG capsule Take 150 mg by mouth as needed.  . DULoxetine (CYMBALTA) 60 MG capsule Take 1 capsule (60 mg total) by mouth 2 (two) times daily.  [name] etanercept (ENBREL SURECLICK) 50 MG/ML injection Inject 0.98 mLs (50 mg total) into the skin once a week.  . levothyroxine (SYNTHROID) 125 MCG tablet Take 1 tablet (125 mcg total) by mouth 2 (two) times daily. Or as directed  . losartan (COZAAR) 50 MG tablet Take 1 tablet (50 mg total) by mouth daily.  . Multiple Vitamin (MULTIVITAMIN) capsule Take 1 capsule by mouth daily.  . rosuvastatin (CRESTOR) 20 MG tablet Take 1 tablet (20 mg total) by mouth daily.  . sildenafil (VIAGRA) 50 MG tablet Take 1 tablet (50 mg total) by mouth daily as needed for erectile dysfunction.  [name] zolpidem (AMBIEN) 10 MG tablet Take 1 tablet (10 mg total) by mouth at bedtime as needed for sleep.  No driving 8 hours after taking.     Allergies:   Lisinopril, Amlodipine, and Penicillins   Social History   Tobacco Use  . Smoking status: Never Smoker  . Smokeless tobacco: Never Used  Substance Use Topics  . Alcohol use: Yes    Alcohol/week: 8.0 - 10.0 standard drinks    Types: 8 - 10 Standard drinks or equivalent per week  . Drug use: No     Family Hx: The patient's family history includes Cancer in his mother; Diabetes in his sister; Hypertension in his father and mother.  ROS:   Please see the history of present illness.     All other systems reviewed and are negative.   Labs/Other Tests and Data Reviewed:    Recent Labs: 01/07/2019: ALT 31; BUN 27; Creatinine, Ser 1.14; Hemoglobin 15.3; Platelets 238.0; Potassium 4.4; Sodium 138 04/07/2019: TSH 0.49   Recent Lipid Panel Lab Results  Component Value Date/Time  CHOL 142 01/07/2019 09:33 AM   TRIG 104.0 01/07/2019 09:33 AM   HDL 57.70 01/07/2019 09:33 AM   CHOLHDL 2 01/07/2019 09:33 AM   LDLCALC 63 01/07/2019 09:33 AM   LDLDIRECT 152.0 02/15/2015 08:49 AM    Wt Readings from Last 3 Encounters:  11/02/19 230 lb (104.3 kg)  02/15/19 229 lb (103.9 kg)  01/07/19 223 lb 6.4 oz (101.3 kg)     Objective:    Vital Signs:  BP (!) 169/110   Pulse 81   Ht 6' (1.829 m)   Wt 230 lb (104.3 kg)   BMI 31.19 kg/m     ASSESSMENT & PLAN:    1.  OSA -The patient is tolerating PAP therapy well without any problems. The PAP download was reviewed today and showed an AHI of 1.7/hr on auto PAP  with 100% compliance in using more than 4 hours nightly.  The patient has been using and benefiting from PAP use and will continue to benefit from therapy. He thinks that he needs to to shorten the time to get to the pressure which I will ask the DME to do.    2.  HTN -BP poorly controlled on exam -continue Losartan 50mg  daily for now  -He took his Losartan around 8am today and so I have asked him to check his BP around lunch today  and call back with results.   3.  Obesity -I have encouraged him to get into a routine exercise program and cut back on carbs and portions.    COVID-19 Education: The signs and symptoms of COVID-19 were discussed with the patient and how to seek care for testing (follow up with PCP or arrange E-visit).  The importance of social distancing was discussed today.  Patient Risk:   After full review of this patient's clinical status, I feel that they are at least moderate risk at this time.  Time:   Today, I have spent 20 minutes on telemedicine discussing medical problems including OSA, HTN, Obesity and reviewing patient's chart including PAP compliance download.  Medication Adjustments/Labs and Tests Ordered: Current medicines are reviewed at length with the patient today.  Concerns regarding medicines are outlined above.  Tests Ordered: No orders of the defined types were placed in this encounter.  Medication Changes: No orders of the defined types were placed in this encounter.   Disposition:  Follow up in 1 year(s)  Signed, Fransico Him, MD  11/02/2019 9:58 AM    [facility]

## 2019-11-02 ENCOUNTER — Telehealth (INDEPENDENT_AMBULATORY_CARE_PROVIDER_SITE_OTHER): Payer: BC Managed Care – PPO | Admitting: Cardiology

## 2019-11-02 ENCOUNTER — Encounter: Payer: Self-pay | Admitting: Cardiology

## 2019-11-02 ENCOUNTER — Other Ambulatory Visit: Payer: Self-pay

## 2019-11-02 VITALS — BP 169/110 | HR 81 | Ht 72.0 in | Wt 230.0 lb

## 2019-11-02 DIAGNOSIS — I10 Essential (primary) hypertension: Secondary | ICD-10-CM

## 2019-11-02 DIAGNOSIS — G4733 Obstructive sleep apnea (adult) (pediatric): Secondary | ICD-10-CM

## 2019-11-02 DIAGNOSIS — E669 Obesity, unspecified: Secondary | ICD-10-CM | POA: Diagnosis not present

## 2019-11-09 ENCOUNTER — Other Ambulatory Visit: Payer: Self-pay

## 2019-11-11 DIAGNOSIS — M47816 Spondylosis without myelopathy or radiculopathy, lumbar region: Secondary | ICD-10-CM | POA: Diagnosis not present

## 2019-11-23 DIAGNOSIS — M47816 Spondylosis without myelopathy or radiculopathy, lumbar region: Secondary | ICD-10-CM | POA: Diagnosis not present

## 2019-11-29 DIAGNOSIS — G4733 Obstructive sleep apnea (adult) (pediatric): Secondary | ICD-10-CM | POA: Diagnosis not present

## 2019-11-29 DIAGNOSIS — R0902 Hypoxemia: Secondary | ICD-10-CM | POA: Diagnosis not present

## 2019-11-30 DIAGNOSIS — M47816 Spondylosis without myelopathy or radiculopathy, lumbar region: Secondary | ICD-10-CM | POA: Diagnosis not present

## 2019-12-07 DIAGNOSIS — M47816 Spondylosis without myelopathy or radiculopathy, lumbar region: Secondary | ICD-10-CM | POA: Diagnosis not present

## 2019-12-09 IMAGING — CT CT ANGIO CHEST
3 of 7 series · 17 of 36 positions shown · IV contrast (APPLIED)
Comparison: CT calcium score of 11/27/2015

CLINICAL DATA: Evaluate thoracic aortic aneurysm.

EXAM:
CT ANGIOGRAPHY CHEST WITH CONTRAST
TECHNIQUE: Multidetector CT imaging of the chest was performed using the
standard protocol during bolus administration of intravenous
contrast. Multiplanar CT image reconstructions and MIPs were
obtained to evaluate the vascular anatomy.
CONTRAST:  80mL OMNIPAQUE IOHEXOL 350 MG/ML SOLN

[Series 18: arterial lung 79 % · axial · arterial · 0.65mm/px · z∈[-213,-33]mm · 7 of 82 slices shown]
[im 11/82  mediastinal]
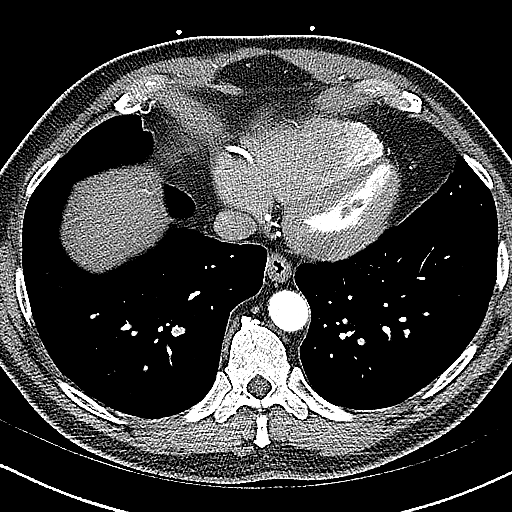
[im 21/82  mediastinal]
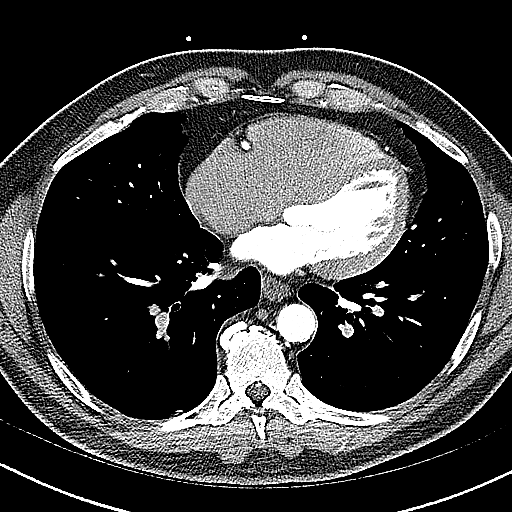
[im 31/82  mediastinal]
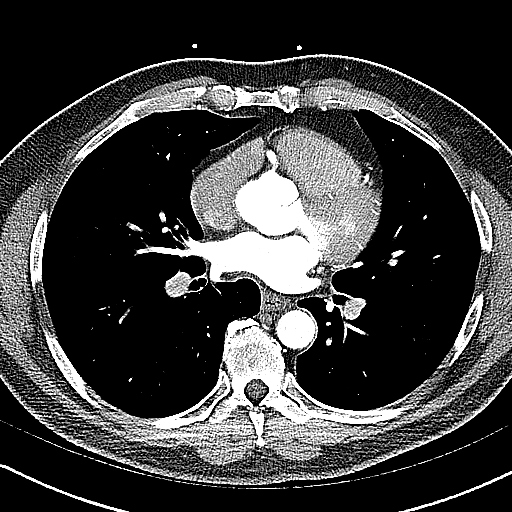
[im 41/82  mediastinal]
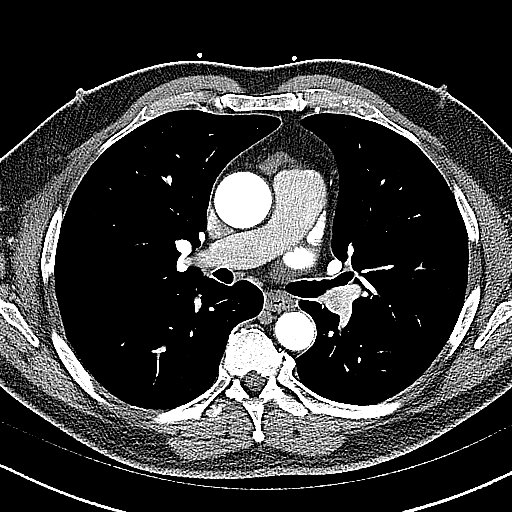
[im 51/82  mediastinal]
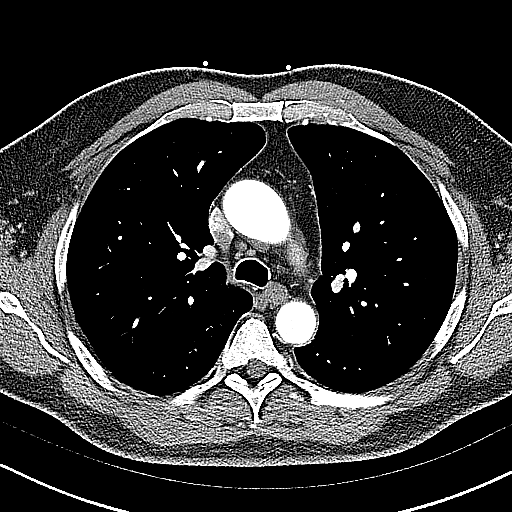
[im 61/82  mediastinal]
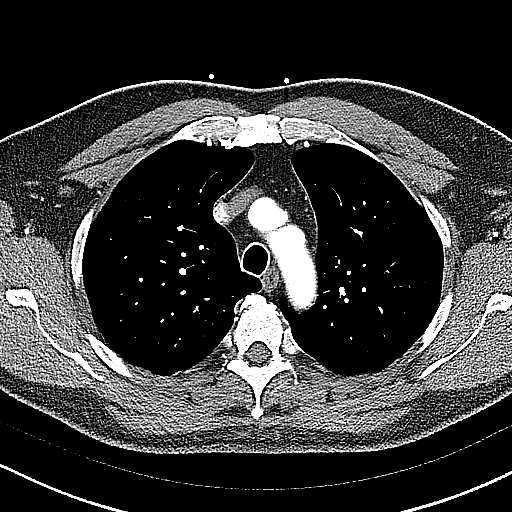
[im 71/82  mediastinal]
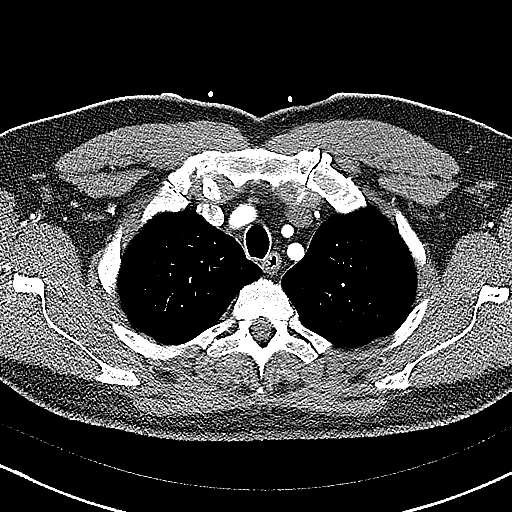

[Series 19: cor soft 79 % · coronal · 0.53mm/px · 1 of 112 slices shown]
[im 56/112  mediastinal]
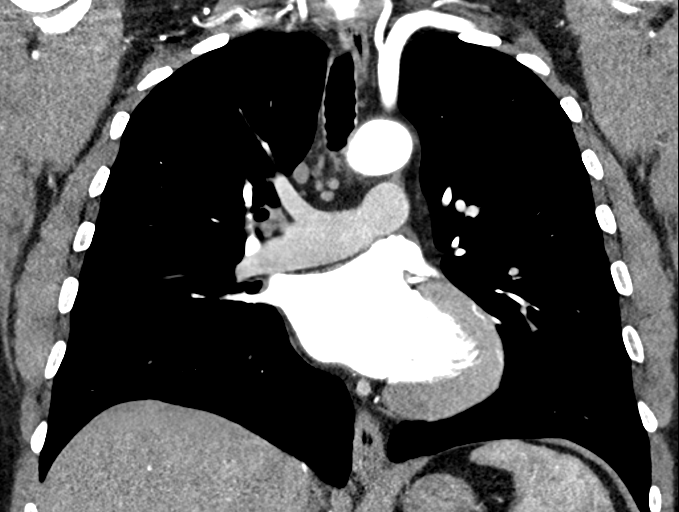

[Series 24: arterial 2 79 % · axial · arterial · 0.65mm/px · z∈[-253,-13]mm · 9 of 101 slices shown]
[im 11/101  lung]
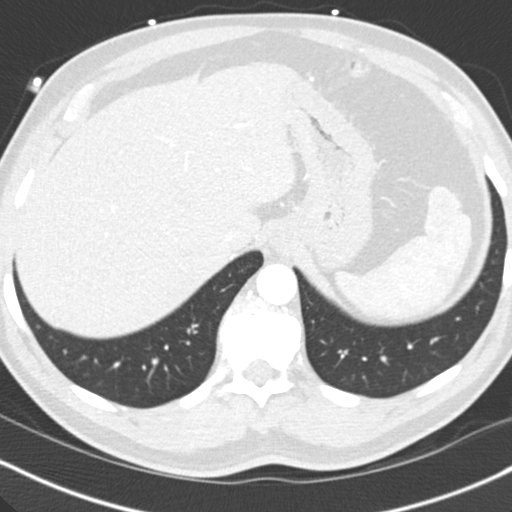
[im 21/101  mediastinal]
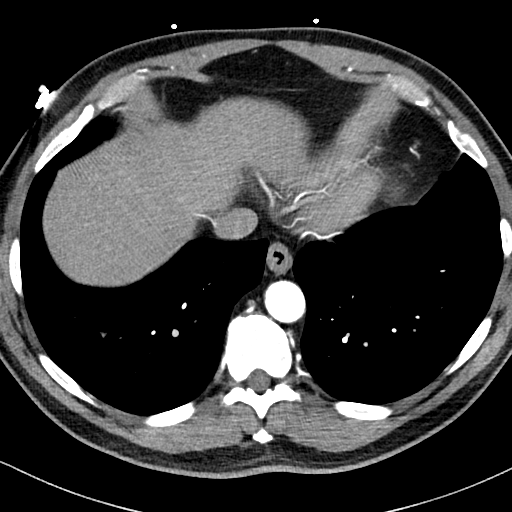
[im 31/101  lung]
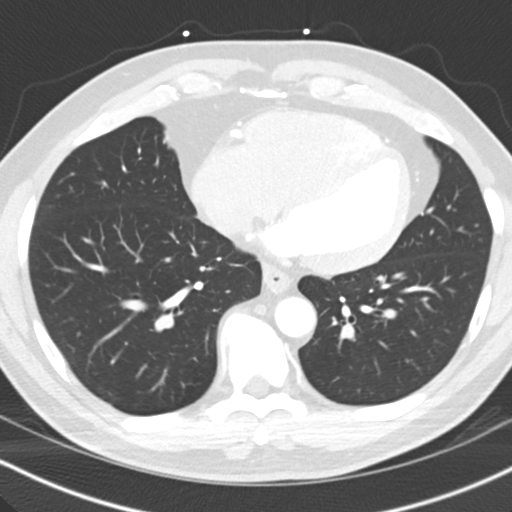
[im 41/101  mediastinal]
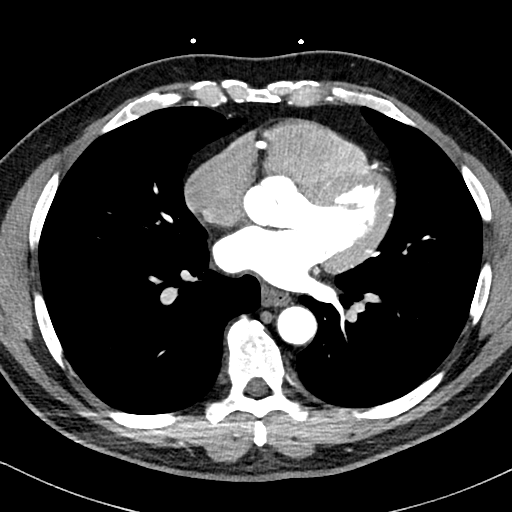
[im 51/101  lung]
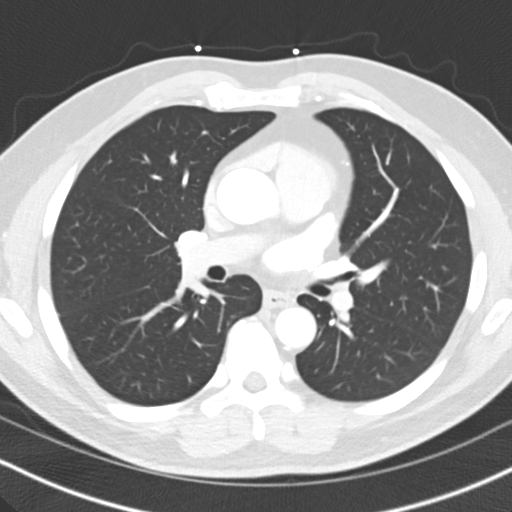
[im 61/101  mediastinal]
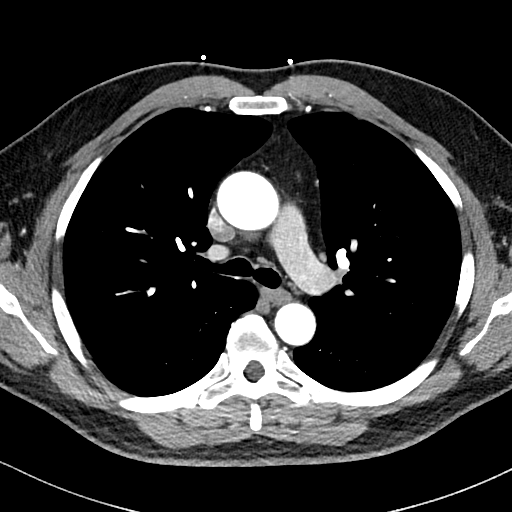
[im 71/101  lung]
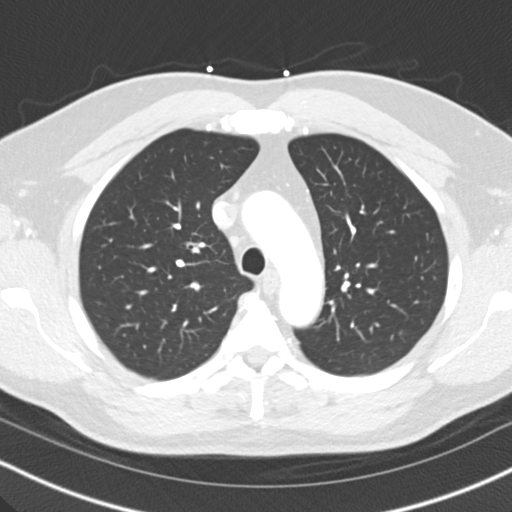
[im 81/101  mediastinal]
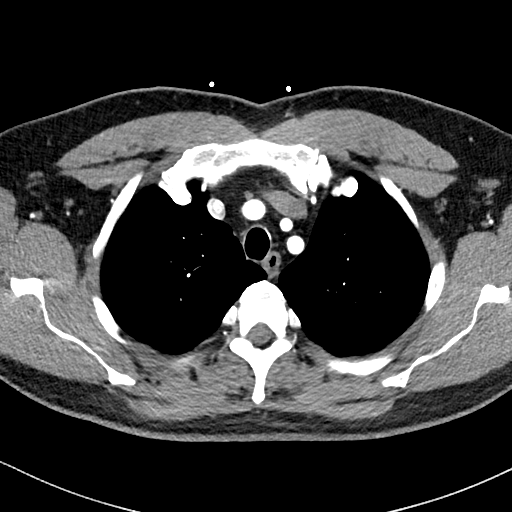
[im 91/101  lung]
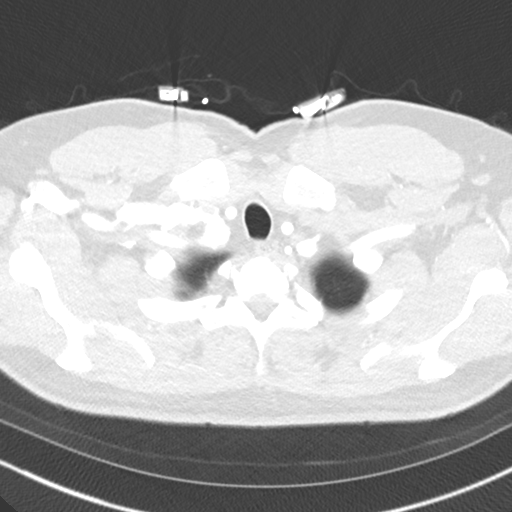

[17 of 36 positions shown; findings below may reference images not displayed]

FINDINGS: Cardiovascular: Aortic atherosclerosis. No dissection. Based on
transverse images, the ascending aorta measures maximally 3.7 cm,
including on 37/17. Similar.

Based on coronal images, the aorta measures 3.7 cm in the sinuses,
2.9 cm at the sinotubular junction, and 3.3 cm in the mid ascending
segment.

Normal caliber great vessels and descending thoracic aorta.

No central pulmonary embolism, on this non-dedicated study.

Mediastinum/Nodes: No mediastinal or hilar adenopathy.

Lungs/Pleura: No pleural fluid. A probable subpleural lymph node
along the left major fissure at 3 mm on 49/18, similar.

Upper Abdomen: Normal imaged portions of the liver, stomach, spleen.

Musculoskeletal: Moderate thoracic spondylosis.

Review of the MIP images confirms the above findings.
IMPRESSION: 1. No evidence of aortic aneurysm.
2.  Aortic Atherosclerosis ([id number]).
3.  No acute process in the chest.

## 2019-12-10 ENCOUNTER — Other Ambulatory Visit: Payer: Self-pay | Admitting: Family Medicine

## 2019-12-29 DIAGNOSIS — R0902 Hypoxemia: Secondary | ICD-10-CM | POA: Diagnosis not present

## 2019-12-29 DIAGNOSIS — G4733 Obstructive sleep apnea (adult) (pediatric): Secondary | ICD-10-CM | POA: Diagnosis not present

## 2020-01-29 DIAGNOSIS — R0902 Hypoxemia: Secondary | ICD-10-CM | POA: Diagnosis not present

## 2020-01-29 DIAGNOSIS — G4733 Obstructive sleep apnea (adult) (pediatric): Secondary | ICD-10-CM | POA: Diagnosis not present

## 2020-01-30 DIAGNOSIS — G4733 Obstructive sleep apnea (adult) (pediatric): Secondary | ICD-10-CM | POA: Diagnosis not present

## 2020-02-29 DIAGNOSIS — R0902 Hypoxemia: Secondary | ICD-10-CM | POA: Diagnosis not present

## 2020-02-29 DIAGNOSIS — G4733 Obstructive sleep apnea (adult) (pediatric): Secondary | ICD-10-CM | POA: Diagnosis not present

## 2020-03-09 ENCOUNTER — Other Ambulatory Visit: Payer: Self-pay | Admitting: Family Medicine

## 2020-03-30 DIAGNOSIS — G4733 Obstructive sleep apnea (adult) (pediatric): Secondary | ICD-10-CM | POA: Diagnosis not present

## 2020-03-30 DIAGNOSIS — R0902 Hypoxemia: Secondary | ICD-10-CM | POA: Diagnosis not present

## 2020-04-30 DIAGNOSIS — G4733 Obstructive sleep apnea (adult) (pediatric): Secondary | ICD-10-CM | POA: Diagnosis not present

## 2020-04-30 DIAGNOSIS — R0902 Hypoxemia: Secondary | ICD-10-CM | POA: Diagnosis not present

## 2020-05-01 ENCOUNTER — Other Ambulatory Visit: Payer: Self-pay | Admitting: Family Medicine

## 2020-05-15 DIAGNOSIS — E039 Hypothyroidism, unspecified: Secondary | ICD-10-CM | POA: Diagnosis not present

## 2020-05-15 DIAGNOSIS — G629 Polyneuropathy, unspecified: Secondary | ICD-10-CM | POA: Diagnosis not present

## 2020-05-15 DIAGNOSIS — G4700 Insomnia, unspecified: Secondary | ICD-10-CM | POA: Diagnosis not present

## 2020-05-15 DIAGNOSIS — Z23 Encounter for immunization: Secondary | ICD-10-CM | POA: Diagnosis not present

## 2020-05-15 DIAGNOSIS — Z6831 Body mass index (BMI) 31.0-31.9, adult: Secondary | ICD-10-CM | POA: Diagnosis not present

## 2020-05-15 DIAGNOSIS — I10 Essential (primary) hypertension: Secondary | ICD-10-CM | POA: Diagnosis not present

## 2020-05-15 DIAGNOSIS — E785 Hyperlipidemia, unspecified: Secondary | ICD-10-CM | POA: Diagnosis not present

## 2020-05-30 DIAGNOSIS — R0902 Hypoxemia: Secondary | ICD-10-CM | POA: Diagnosis not present

## 2020-05-30 DIAGNOSIS — G4733 Obstructive sleep apnea (adult) (pediatric): Secondary | ICD-10-CM | POA: Diagnosis not present

## 2020-06-12 ENCOUNTER — Telehealth: Payer: Self-pay

## 2020-06-12 NOTE — Telephone Encounter (Signed)
[facility] in regards to this patients Cymbalta Medication. The pharmacist wanted to know that the patient supposed to be on the Cymbalta and the Lexapro I told her that from my records he is supposed to be on his Cymbalta but I am not sure about the Lexapro because the last time he was seen in the office he wasn't on that medication. I advised her to give the prescribing doctor a call just to make sure the patient is supposed to be on both medications. ( I didn't want to give her any false information)  She gave a verbal understanding and we disconnected the call.

## 2020-06-27 DIAGNOSIS — M1990 Unspecified osteoarthritis, unspecified site: Secondary | ICD-10-CM | POA: Diagnosis not present

## 2020-06-27 DIAGNOSIS — L4052 Psoriatic arthritis mutilans: Secondary | ICD-10-CM | POA: Diagnosis not present

## 2020-06-27 DIAGNOSIS — Z6832 Body mass index (BMI) 32.0-32.9, adult: Secondary | ICD-10-CM | POA: Diagnosis not present

## 2020-06-27 DIAGNOSIS — Z79899 Other long term (current) drug therapy: Secondary | ICD-10-CM | POA: Diagnosis not present

## 2020-06-30 DIAGNOSIS — G4733 Obstructive sleep apnea (adult) (pediatric): Secondary | ICD-10-CM | POA: Diagnosis not present

## 2020-06-30 DIAGNOSIS — R0902 Hypoxemia: Secondary | ICD-10-CM | POA: Diagnosis not present

## 2020-07-16 DIAGNOSIS — I10 Essential (primary) hypertension: Secondary | ICD-10-CM | POA: Diagnosis not present

## 2020-07-16 DIAGNOSIS — Z6832 Body mass index (BMI) 32.0-32.9, adult: Secondary | ICD-10-CM | POA: Diagnosis not present

## 2020-07-16 DIAGNOSIS — N183 Chronic kidney disease, stage 3 unspecified: Secondary | ICD-10-CM | POA: Diagnosis not present

## 2020-07-16 DIAGNOSIS — L4050 Arthropathic psoriasis, unspecified: Secondary | ICD-10-CM | POA: Diagnosis not present

## 2020-07-16 DIAGNOSIS — B351 Tinea unguium: Secondary | ICD-10-CM | POA: Diagnosis not present

## 2020-07-31 DIAGNOSIS — G4733 Obstructive sleep apnea (adult) (pediatric): Secondary | ICD-10-CM | POA: Diagnosis not present

## 2020-07-31 DIAGNOSIS — R0902 Hypoxemia: Secondary | ICD-10-CM | POA: Diagnosis not present

## 2020-08-06 DIAGNOSIS — G4733 Obstructive sleep apnea (adult) (pediatric): Secondary | ICD-10-CM | POA: Diagnosis not present

## 2020-08-15 DIAGNOSIS — L4050 Arthropathic psoriasis, unspecified: Secondary | ICD-10-CM | POA: Diagnosis not present

## 2020-08-15 DIAGNOSIS — I10 Essential (primary) hypertension: Secondary | ICD-10-CM | POA: Diagnosis not present

## 2020-08-15 DIAGNOSIS — G4733 Obstructive sleep apnea (adult) (pediatric): Secondary | ICD-10-CM | POA: Diagnosis not present

## 2020-08-15 DIAGNOSIS — Z9989 Dependence on other enabling machines and devices: Secondary | ICD-10-CM | POA: Diagnosis not present

## 2020-08-15 DIAGNOSIS — Z6832 Body mass index (BMI) 32.0-32.9, adult: Secondary | ICD-10-CM | POA: Diagnosis not present

## 2020-08-28 DIAGNOSIS — G4733 Obstructive sleep apnea (adult) (pediatric): Secondary | ICD-10-CM | POA: Diagnosis not present

## 2020-08-28 DIAGNOSIS — R0902 Hypoxemia: Secondary | ICD-10-CM | POA: Diagnosis not present

## 2020-08-29 DIAGNOSIS — Z79899 Other long term (current) drug therapy: Secondary | ICD-10-CM | POA: Diagnosis not present

## 2020-08-29 DIAGNOSIS — L409 Psoriasis, unspecified: Secondary | ICD-10-CM | POA: Diagnosis not present

## 2020-08-29 DIAGNOSIS — L4052 Psoriatic arthritis mutilans: Secondary | ICD-10-CM | POA: Diagnosis not present

## 2020-08-29 DIAGNOSIS — M1990 Unspecified osteoarthritis, unspecified site: Secondary | ICD-10-CM | POA: Diagnosis not present

## 2020-09-24 DIAGNOSIS — L4050 Arthropathic psoriasis, unspecified: Secondary | ICD-10-CM | POA: Diagnosis not present

## 2020-09-24 DIAGNOSIS — N183 Chronic kidney disease, stage 3 unspecified: Secondary | ICD-10-CM | POA: Diagnosis not present

## 2020-09-24 DIAGNOSIS — E039 Hypothyroidism, unspecified: Secondary | ICD-10-CM | POA: Diagnosis not present

## 2020-09-24 DIAGNOSIS — Z125 Encounter for screening for malignant neoplasm of prostate: Secondary | ICD-10-CM | POA: Diagnosis not present

## 2020-09-24 DIAGNOSIS — B351 Tinea unguium: Secondary | ICD-10-CM | POA: Diagnosis not present

## 2020-09-24 DIAGNOSIS — Z6831 Body mass index (BMI) 31.0-31.9, adult: Secondary | ICD-10-CM | POA: Diagnosis not present

## 2020-09-24 DIAGNOSIS — I10 Essential (primary) hypertension: Secondary | ICD-10-CM | POA: Diagnosis not present

## 2020-09-28 DIAGNOSIS — R0902 Hypoxemia: Secondary | ICD-10-CM | POA: Diagnosis not present

## 2020-09-28 DIAGNOSIS — G4733 Obstructive sleep apnea (adult) (pediatric): Secondary | ICD-10-CM | POA: Diagnosis not present

## 2020-10-25 ENCOUNTER — Other Ambulatory Visit: Payer: Self-pay | Admitting: Family Medicine

## 2020-10-28 DIAGNOSIS — G4733 Obstructive sleep apnea (adult) (pediatric): Secondary | ICD-10-CM | POA: Diagnosis not present

## 2020-10-28 DIAGNOSIS — R0902 Hypoxemia: Secondary | ICD-10-CM | POA: Diagnosis not present

## 2020-11-05 DIAGNOSIS — G4733 Obstructive sleep apnea (adult) (pediatric): Secondary | ICD-10-CM | POA: Diagnosis not present

## 2020-11-14 DIAGNOSIS — Z6830 Body mass index (BMI) 30.0-30.9, adult: Secondary | ICD-10-CM | POA: Diagnosis not present

## 2020-11-14 DIAGNOSIS — M1990 Unspecified osteoarthritis, unspecified site: Secondary | ICD-10-CM | POA: Diagnosis not present

## 2020-11-14 DIAGNOSIS — Z9989 Dependence on other enabling machines and devices: Secondary | ICD-10-CM | POA: Diagnosis not present

## 2020-11-14 DIAGNOSIS — L4050 Arthropathic psoriasis, unspecified: Secondary | ICD-10-CM | POA: Diagnosis not present

## 2020-11-14 DIAGNOSIS — M654 Radial styloid tenosynovitis [de Quervain]: Secondary | ICD-10-CM | POA: Diagnosis not present

## 2020-11-14 DIAGNOSIS — G4733 Obstructive sleep apnea (adult) (pediatric): Secondary | ICD-10-CM | POA: Diagnosis not present

## 2020-11-14 DIAGNOSIS — L4052 Psoriatic arthritis mutilans: Secondary | ICD-10-CM | POA: Diagnosis not present

## 2020-11-14 DIAGNOSIS — Z79899 Other long term (current) drug therapy: Secondary | ICD-10-CM | POA: Diagnosis not present

## 2020-11-14 DIAGNOSIS — I10 Essential (primary) hypertension: Secondary | ICD-10-CM | POA: Diagnosis not present

## 2020-11-28 ENCOUNTER — Encounter: Payer: Self-pay | Admitting: Cardiology

## 2020-11-28 ENCOUNTER — Other Ambulatory Visit: Payer: Self-pay

## 2020-11-28 ENCOUNTER — Telehealth (INDEPENDENT_AMBULATORY_CARE_PROVIDER_SITE_OTHER): Payer: BC Managed Care – PPO | Admitting: Cardiology

## 2020-11-28 VITALS — Ht 72.0 in | Wt 230.0 lb

## 2020-11-28 DIAGNOSIS — G4734 Idiopathic sleep related nonobstructive alveolar hypoventilation: Secondary | ICD-10-CM

## 2020-11-28 DIAGNOSIS — I10 Essential (primary) hypertension: Secondary | ICD-10-CM

## 2020-11-28 DIAGNOSIS — R0902 Hypoxemia: Secondary | ICD-10-CM | POA: Diagnosis not present

## 2020-11-28 DIAGNOSIS — G4733 Obstructive sleep apnea (adult) (pediatric): Secondary | ICD-10-CM | POA: Diagnosis not present

## 2020-11-28 DIAGNOSIS — E669 Obesity, unspecified: Secondary | ICD-10-CM | POA: Diagnosis not present

## 2020-11-28 NOTE — Patient Instructions (Signed)
Medication Instructions:  Your physician recommends that you continue on your current medications as directed. Please refer to the Current Medication list given to you today.  *If you need a refill on your cardiac medications before your next appointment, please call your pharmacy*  Follow-Up: At [facility], you and your health needs are our priority.  As part of our continuing mission to provide you with exceptional heart care, we have created designated Provider Care Teams.  These Care Teams include your primary Cardiologist (physician) and Advanced Practice Providers (APPs -  Physician Assistants and Nurse Practitioners) who all work together to provide you with the care you need, when you need it.  Follow up with Dr. Turner as needed 

## 2020-11-28 NOTE — Progress Notes (Signed)
Virtual Visit via Video Note   This visit type was conducted due to national recommendations for restrictions regarding the COVID-19 Pandemic (e.g. social distancing) in an effort to limit this patient's exposure and mitigate transmission in our community.  Due to his co-morbid illnesses, this patient is at least at moderate risk for complications without adequate follow up.  This format is felt to be most appropriate for this patient at this time.   All issues noted in this document were discussed and addressed.  Please refer to the patient's chart for his  consent to telehealth for [facility].   Evaluation Performed:  Follow-up visit  Date:  11/28/2020   ID:  Jared Martin, DOB 06-Mar-1961, MRN [id number]  Patient Location:  Home  Provider location:   [place]  PCP:  Marin Olp, MD  Cardiologist:  Glori Bickers, MD Sleep Medicine:  Fransico Him, MD Electrophysiologist:  None   Chief Complaint:  OSA, HTN and obesity  History of Present Illness:    Jared Martin is a 60 y.o. male who presents via audio/video conferencing for a telehealth visit today.    Jared Martin is a 60 y.o. male with a hx of hypertension, hyperlipidemia, hyperlipidemia with metabolic syndrome and snoring who was referred by Dr. Haroldine Laws for evaluation for possible sleep apnea.  He underwent home sleep study which showed mild obstructive sleep apnea with an AHI of 5.6/h and nocturnal hypoxemia with O2 saturations less than 89% for 30 minutes out of total sleep time.  Insurance denied in lab study and he underwent CPAP auto titration at home.  He is also using supplemental O2 with his PAP device.   He is doing well with his CPAP device and thinks that he has gotten used to it.  He tolerates the FFM under the nose mask and feels the pressure is adequate.  Since going on CPAP he feels rested in the am and has no significant daytime sleepiness. He still will occasionally take a nap in the  afternoon since retiring from work.   He denies any significant nasal dryness or nasal congestion.  He occasionally has some mild dry mouth.  He does not think that he snores.    The patient does not have symptoms concerning for COVID-19 infection (fever, chills, cough, or new shortness of breath).   Prior CV studies:   The following studies were reviewed today:  PAP compliance download  Past Medical History:  Diagnosis Date  . Abdominal bruit 12/28/2015  . Arthritis   . Erectile dysfunction 02/17/2014   Viagra   . GAD (generalized anxiety disorder) 02/17/2014   Cymbalta 120mg  daily. Xanax use typically once a month maximum. Consider CBT if worsens (never had)   . Hyperlipemia 11/22/2015  . Hypertension   . Hypothyroidism 02/17/2014   S/p total thyroidectomy due to history thyroid cancer. Follows still at [facility] with endocrinologist.    . Immunosuppression ([place]) 02/17/2014   On Enbrel-no live vaccines. Response may be less to typical pneumovax, tdap.    . Insomnia 02/17/2014   Intermittent Ambien use when not at home. Typically sleeps well at home.    . OSA (obstructive sleep apnea) 03/29/2018   Mild with AHI 5.6/hr with nocturnal hypoxemia.  Now on CPAP.  [name] Psoriatic arthritis ([place]) 02/17/2014   Dr. Ouida Sills [facility]. On Enbrel. Discovered at time of knee replacement.    . Sciatica of left side 02/17/2014   Following [facility].  04/05/14  Neurosurgery Dr.  Elsner who recommended discdecompression for herniated disc at L4-L5 and L5-s1   . Thyroid cancer ([place])    s/p removal, now hypothyroid  . Total knee replacement status 02/17/2014   Clindamycin prn for dental prophylaxis-prescribed by dentist.     Past Surgical History:  Procedure Laterality Date  . CERVICAL DISCECTOMY  2015  . KNEE SURGERY Right 1980-2011  . LUMBAR DISC SURGERY     l4-l5, l5-s1- ruptured disc. Dr. Ellene Route  . right foot Right 2005  . SHOULDER SURGERY Right 1983  . THYROIDECTOMY  Bilateral 2000     Current Meds  Medication Sig  . aspirin EC 81 MG tablet Take 1 tablet (81 mg total) by mouth daily.  . Calcium Carbonate-Vitamin D (CALCIUM-VITAMIN D) 500-200 MG-UNIT tablet Take 1 tablet by mouth daily.  . cholecalciferol (VITAMIN D) 1000 units tablet Take 2,000 Units by mouth daily.  . clindamycin (CLEOCIN) 150 MG capsule Take 150 mg by mouth as needed (DEntal appointments).  . escitalopram (LEXAPRO) 10 MG tablet Take 1 tablet by mouth daily.  [name] HUMIRA PEN 40 MG/0.4ML PNKT SMARTSIG:40 Milligram(s) SUB-Q Every 2 Weeks  . levothyroxine (SYNTHROID) 125 MCG tablet Take 1 tablet (125 mcg total) by mouth 2 (two) times daily. Or as directed  . losartan-hydrochlorothiazide (HYZAAR) 50-12.5 MG tablet Take 1 tablet by mouth daily.  . Multiple Vitamin (MULTIVITAMIN) capsule Take 1 capsule by mouth daily.  . rosuvastatin (CRESTOR) 20 MG tablet Take 1 tablet (20 mg total) by mouth daily.  . sildenafil (VIAGRA) 50 MG tablet Take 1 tablet (50 mg total) by mouth daily as needed for erectile dysfunction.  . terbinafine (LAMISIL) 250 MG tablet Take 250 mg by mouth daily.     Allergies:   Lisinopril, Amlodipine, and Penicillins   Social History   Tobacco Use  . Smoking status: Never Smoker  . Smokeless tobacco: Never Used  Substance Use Topics  . Alcohol use: Yes    Alcohol/week: 8.0 - 10.0 standard drinks    Types: 8 - 10 Standard drinks or equivalent per week  . Drug use: No     Family Hx: The patient's family history includes Cancer in his mother; Diabetes in his sister; Hypertension in his father and mother.  ROS:   Please see the history of present illness.     All other systems reviewed and are negative.   Labs/Other Tests and Data Reviewed:    Recent Labs: No results found for requested labs within last 8760 hours.   Recent Lipid Panel Lab Results  Component Value Date/Time   CHOL 142 01/07/2019 09:33 AM   TRIG 104.0 01/07/2019 09:33 AM   HDL 57.70  01/07/2019 09:33 AM   CHOLHDL 2 01/07/2019 09:33 AM   LDLCALC 63 01/07/2019 09:33 AM   LDLDIRECT 152.0 02/15/2015 08:49 AM    Wt Readings from Last 3 Encounters:  11/28/20 230 lb (104.3 kg)  11/02/19 230 lb (104.3 kg)  02/15/19 229 lb (103.9 kg)     Objective:    Vital Signs:  Ht 6' (1.829 m)   Wt 230 lb (104.3 kg)   BMI 31.19 kg/m    Well nourished, well developed male in no acute distress. Well appearing, alert and conversant, regular work of breathing,  good skin color  Eyes- anicteric mouth- oral mucosa is pink  neuro- grossly intact skin- no apparent rash or lesions or cyanosis   ASSESSMENT & PLAN:    1.  OSA/Nocturnal Hypoxemia - The patient is tolerating PAP therapy well without  any problems. The PAP download performed by his DME was personally reviewed and interpreted by me today and showed an AHI of 1.2/hr on auto CPAP  with 100% compliance in using more than 4 hours nightly.  The patient has been using and benefiting from PAP use and will continue to benefit from therapy.  -he is now living in [place] and his PCP is going to refer him to a sleep medicine MD in [place]  -he will continue on O2 at 2L with his PAP nightly >>his last Overnight O2 on PAP in 2019 was > 90% with no desaturations  2.  HTN -BP is well controlled at home and he thinks it is related to retirement in the 120/70's -Continue prescription drug management with Losartan HCT 20-12.5mg  daily with PRN refills -his PCP is following his labs in [place], [place]  3.  Obesity -he put on some weight when he first retired -he is now in retirement and is walking more and lifting weights -he is trying to eat better as well  COVID-19 Education: The signs and symptoms of COVID-19 were discussed with the patient and how to seek care for testing (follow up with PCP or arrange E-visit).  The importance of social distancing was discussed today.  Patient Risk:   After full review of this patient's clinical status, I feel  that they are at least moderate risk at this time.  Time:   I spent 20 minutes dedicated to the care of this patient on the date of this encounter to include pre-visit review of rec ords, face-to-face time with the patient discussing treatment of his OSA, HTN and obesity, reviewing his last overnight pulse ox test on O2 and PAP from 2019 and most recent PAP download.  Medication Adjustments/Labs and Tests Ordered: Current medicines are reviewed at length with the patient today.  Concerns regarding medicines are outlined above.  Tests Ordered: No orders of the defined types were placed in this encounter.  Medication Changes: No orders of the defined types were placed in this encounter.   Disposition:  Follow up PRN since he has moved  Signed, Fransico Him, MD  11/28/2020 9:42 AM    [place]

## 2020-12-27 ENCOUNTER — Telehealth: Payer: BC Managed Care – PPO | Admitting: Cardiology

## 2020-12-28 DIAGNOSIS — G4733 Obstructive sleep apnea (adult) (pediatric): Secondary | ICD-10-CM | POA: Diagnosis not present

## 2020-12-28 DIAGNOSIS — R0902 Hypoxemia: Secondary | ICD-10-CM | POA: Diagnosis not present

## 2021-01-02 DIAGNOSIS — Z79899 Other long term (current) drug therapy: Secondary | ICD-10-CM | POA: Diagnosis not present

## 2021-01-02 DIAGNOSIS — L4052 Psoriatic arthritis mutilans: Secondary | ICD-10-CM | POA: Diagnosis not present

## 2021-01-02 DIAGNOSIS — M654 Radial styloid tenosynovitis [de Quervain]: Secondary | ICD-10-CM | POA: Diagnosis not present

## 2021-01-02 DIAGNOSIS — L409 Psoriasis, unspecified: Secondary | ICD-10-CM | POA: Diagnosis not present

## 2021-01-08 DIAGNOSIS — R197 Diarrhea, unspecified: Secondary | ICD-10-CM | POA: Diagnosis not present

## 2021-01-08 DIAGNOSIS — N183 Chronic kidney disease, stage 3 unspecified: Secondary | ICD-10-CM | POA: Diagnosis not present

## 2021-01-08 DIAGNOSIS — I10 Essential (primary) hypertension: Secondary | ICD-10-CM | POA: Diagnosis not present

## 2021-01-08 DIAGNOSIS — Z6829 Body mass index (BMI) 29.0-29.9, adult: Secondary | ICD-10-CM | POA: Diagnosis not present

## 2021-01-08 DIAGNOSIS — E785 Hyperlipidemia, unspecified: Secondary | ICD-10-CM | POA: Diagnosis not present

## 2021-01-08 DIAGNOSIS — L4050 Arthropathic psoriasis, unspecified: Secondary | ICD-10-CM | POA: Diagnosis not present

## 2021-01-28 DIAGNOSIS — E039 Hypothyroidism, unspecified: Secondary | ICD-10-CM | POA: Diagnosis not present

## 2021-01-28 DIAGNOSIS — I498 Other specified cardiac arrhythmias: Secondary | ICD-10-CM | POA: Diagnosis not present

## 2021-01-28 DIAGNOSIS — Z85850 Personal history of malignant neoplasm of thyroid: Secondary | ICD-10-CM | POA: Diagnosis not present

## 2021-01-28 DIAGNOSIS — R001 Bradycardia, unspecified: Secondary | ICD-10-CM | POA: Diagnosis not present

## 2021-01-28 DIAGNOSIS — Z6830 Body mass index (BMI) 30.0-30.9, adult: Secondary | ICD-10-CM | POA: Diagnosis not present

## 2021-01-28 DIAGNOSIS — I10 Essential (primary) hypertension: Secondary | ICD-10-CM | POA: Diagnosis not present

## 2021-02-06 DIAGNOSIS — E039 Hypothyroidism, unspecified: Secondary | ICD-10-CM | POA: Diagnosis not present

## 2021-02-06 DIAGNOSIS — I491 Atrial premature depolarization: Secondary | ICD-10-CM | POA: Diagnosis not present

## 2021-02-06 DIAGNOSIS — R002 Palpitations: Secondary | ICD-10-CM | POA: Diagnosis not present

## 2021-02-06 DIAGNOSIS — R42 Dizziness and giddiness: Secondary | ICD-10-CM | POA: Diagnosis not present

## 2021-02-06 DIAGNOSIS — G4733 Obstructive sleep apnea (adult) (pediatric): Secondary | ICD-10-CM | POA: Diagnosis not present

## 2021-02-06 DIAGNOSIS — I493 Ventricular premature depolarization: Secondary | ICD-10-CM | POA: Diagnosis not present

## 2021-02-06 DIAGNOSIS — I10 Essential (primary) hypertension: Secondary | ICD-10-CM | POA: Diagnosis not present

## 2021-02-26 DIAGNOSIS — G4733 Obstructive sleep apnea (adult) (pediatric): Secondary | ICD-10-CM | POA: Diagnosis not present

## 2021-02-26 DIAGNOSIS — I10 Essential (primary) hypertension: Secondary | ICD-10-CM | POA: Diagnosis not present

## 2021-02-26 DIAGNOSIS — I493 Ventricular premature depolarization: Secondary | ICD-10-CM | POA: Diagnosis not present

## 2021-02-26 DIAGNOSIS — I491 Atrial premature depolarization: Secondary | ICD-10-CM | POA: Diagnosis not present

## 2021-03-09 DIAGNOSIS — G4733 Obstructive sleep apnea (adult) (pediatric): Secondary | ICD-10-CM | POA: Diagnosis not present

## 2021-03-21 DIAGNOSIS — I10 Essential (primary) hypertension: Secondary | ICD-10-CM | POA: Diagnosis not present

## 2021-03-21 DIAGNOSIS — E039 Hypothyroidism, unspecified: Secondary | ICD-10-CM | POA: Diagnosis not present

## 2021-03-21 DIAGNOSIS — G4733 Obstructive sleep apnea (adult) (pediatric): Secondary | ICD-10-CM | POA: Diagnosis not present

## 2021-03-21 DIAGNOSIS — I493 Ventricular premature depolarization: Secondary | ICD-10-CM | POA: Diagnosis not present

## 2021-03-26 DIAGNOSIS — Z136 Encounter for screening for cardiovascular disorders: Secondary | ICD-10-CM | POA: Diagnosis not present

## 2021-03-26 DIAGNOSIS — K529 Noninfective gastroenteritis and colitis, unspecified: Secondary | ICD-10-CM | POA: Diagnosis not present

## 2021-03-26 DIAGNOSIS — Z6830 Body mass index (BMI) 30.0-30.9, adult: Secondary | ICD-10-CM | POA: Diagnosis not present

## 2021-03-26 DIAGNOSIS — I498 Other specified cardiac arrhythmias: Secondary | ICD-10-CM | POA: Diagnosis not present

## 2021-03-26 DIAGNOSIS — Z23 Encounter for immunization: Secondary | ICD-10-CM | POA: Diagnosis not present

## 2021-03-26 DIAGNOSIS — R5383 Other fatigue: Secondary | ICD-10-CM | POA: Diagnosis not present

## 2021-03-26 DIAGNOSIS — E039 Hypothyroidism, unspecified: Secondary | ICD-10-CM | POA: Diagnosis not present

## 2021-04-29 DIAGNOSIS — L4052 Psoriatic arthritis mutilans: Secondary | ICD-10-CM | POA: Diagnosis not present

## 2021-04-29 DIAGNOSIS — R197 Diarrhea, unspecified: Secondary | ICD-10-CM | POA: Diagnosis not present

## 2021-04-29 DIAGNOSIS — Z79899 Other long term (current) drug therapy: Secondary | ICD-10-CM | POA: Diagnosis not present

## 2021-04-29 DIAGNOSIS — L409 Psoriasis, unspecified: Secondary | ICD-10-CM | POA: Diagnosis not present

## 2021-05-20 DIAGNOSIS — I10 Essential (primary) hypertension: Secondary | ICD-10-CM | POA: Diagnosis not present

## 2021-05-20 DIAGNOSIS — Z9989 Dependence on other enabling machines and devices: Secondary | ICD-10-CM | POA: Diagnosis not present

## 2021-05-20 DIAGNOSIS — G4733 Obstructive sleep apnea (adult) (pediatric): Secondary | ICD-10-CM | POA: Diagnosis not present

## 2021-05-20 DIAGNOSIS — Z6831 Body mass index (BMI) 31.0-31.9, adult: Secondary | ICD-10-CM | POA: Diagnosis not present

## 2021-05-20 DIAGNOSIS — L4050 Arthropathic psoriasis, unspecified: Secondary | ICD-10-CM | POA: Diagnosis not present

## 2021-05-24 DIAGNOSIS — Z96651 Presence of right artificial knee joint: Secondary | ICD-10-CM | POA: Diagnosis not present

## 2021-05-24 DIAGNOSIS — M25461 Effusion, right knee: Secondary | ICD-10-CM | POA: Diagnosis not present

## 2021-05-24 DIAGNOSIS — M25561 Pain in right knee: Secondary | ICD-10-CM | POA: Diagnosis not present

## 2021-05-28 DIAGNOSIS — E039 Hypothyroidism, unspecified: Secondary | ICD-10-CM | POA: Diagnosis not present

## 2021-05-28 DIAGNOSIS — I10 Essential (primary) hypertension: Secondary | ICD-10-CM | POA: Diagnosis not present

## 2021-05-28 DIAGNOSIS — N183 Chronic kidney disease, stage 3 unspecified: Secondary | ICD-10-CM | POA: Diagnosis not present

## 2021-05-28 DIAGNOSIS — Z6832 Body mass index (BMI) 32.0-32.9, adult: Secondary | ICD-10-CM | POA: Diagnosis not present

## 2021-05-28 DIAGNOSIS — G4733 Obstructive sleep apnea (adult) (pediatric): Secondary | ICD-10-CM | POA: Diagnosis not present

## 2021-06-03 DIAGNOSIS — L400 Psoriasis vulgaris: Secondary | ICD-10-CM | POA: Diagnosis not present

## 2021-06-03 DIAGNOSIS — L814 Other melanin hyperpigmentation: Secondary | ICD-10-CM | POA: Diagnosis not present

## 2021-06-27 DIAGNOSIS — E039 Hypothyroidism, unspecified: Secondary | ICD-10-CM | POA: Diagnosis not present

## 2021-06-27 DIAGNOSIS — G4733 Obstructive sleep apnea (adult) (pediatric): Secondary | ICD-10-CM | POA: Diagnosis not present

## 2021-06-27 DIAGNOSIS — I493 Ventricular premature depolarization: Secondary | ICD-10-CM | POA: Diagnosis not present

## 2021-06-27 DIAGNOSIS — I10 Essential (primary) hypertension: Secondary | ICD-10-CM | POA: Diagnosis not present

## 2021-07-23 DIAGNOSIS — Z79899 Other long term (current) drug therapy: Secondary | ICD-10-CM | POA: Diagnosis not present

## 2021-07-23 DIAGNOSIS — L4052 Psoriatic arthritis mutilans: Secondary | ICD-10-CM | POA: Diagnosis not present

## 2021-07-23 DIAGNOSIS — L409 Psoriasis, unspecified: Secondary | ICD-10-CM | POA: Diagnosis not present

## 2021-07-23 DIAGNOSIS — R197 Diarrhea, unspecified: Secondary | ICD-10-CM | POA: Diagnosis not present

## 2021-08-29 DIAGNOSIS — L4052 Psoriatic arthritis mutilans: Secondary | ICD-10-CM | POA: Diagnosis not present

## 2021-08-29 DIAGNOSIS — Z79899 Other long term (current) drug therapy: Secondary | ICD-10-CM | POA: Diagnosis not present

## 2021-08-29 DIAGNOSIS — R197 Diarrhea, unspecified: Secondary | ICD-10-CM | POA: Diagnosis not present

## 2021-08-29 DIAGNOSIS — M1990 Unspecified osteoarthritis, unspecified site: Secondary | ICD-10-CM | POA: Diagnosis not present

## 2021-09-10 DIAGNOSIS — N1831 Chronic kidney disease, stage 3a: Secondary | ICD-10-CM | POA: Diagnosis not present

## 2021-09-10 DIAGNOSIS — Z01810 Encounter for preprocedural cardiovascular examination: Secondary | ICD-10-CM | POA: Diagnosis not present

## 2021-09-10 DIAGNOSIS — E785 Hyperlipidemia, unspecified: Secondary | ICD-10-CM | POA: Diagnosis not present

## 2021-09-10 DIAGNOSIS — I129 Hypertensive chronic kidney disease with stage 1 through stage 4 chronic kidney disease, or unspecified chronic kidney disease: Secondary | ICD-10-CM | POA: Diagnosis not present

## 2021-09-10 DIAGNOSIS — T84052A Periprosthetic osteolysis of internal prosthetic right knee joint, initial encounter: Secondary | ICD-10-CM | POA: Diagnosis not present

## 2021-09-10 DIAGNOSIS — Z6832 Body mass index (BMI) 32.0-32.9, adult: Secondary | ICD-10-CM | POA: Diagnosis not present

## 2021-09-10 DIAGNOSIS — N183 Chronic kidney disease, stage 3 unspecified: Secondary | ICD-10-CM | POA: Diagnosis not present

## 2021-09-10 DIAGNOSIS — R29898 Other symptoms and signs involving the musculoskeletal system: Secondary | ICD-10-CM | POA: Diagnosis not present

## 2021-09-10 DIAGNOSIS — Z880 Allergy status to penicillin: Secondary | ICD-10-CM | POA: Diagnosis not present

## 2021-09-10 DIAGNOSIS — G4733 Obstructive sleep apnea (adult) (pediatric): Secondary | ICD-10-CM | POA: Diagnosis not present

## 2021-09-10 DIAGNOSIS — R262 Difficulty in walking, not elsewhere classified: Secondary | ICD-10-CM | POA: Diagnosis not present

## 2021-09-10 DIAGNOSIS — E890 Postprocedural hypothyroidism: Secondary | ICD-10-CM | POA: Diagnosis not present

## 2021-09-10 DIAGNOSIS — Z96659 Presence of unspecified artificial knee joint: Secondary | ICD-10-CM | POA: Diagnosis not present

## 2021-09-10 DIAGNOSIS — Z85850 Personal history of malignant neoplasm of thyroid: Secondary | ICD-10-CM | POA: Diagnosis not present

## 2021-09-10 DIAGNOSIS — Z79899 Other long term (current) drug therapy: Secondary | ICD-10-CM | POA: Diagnosis not present

## 2021-09-10 DIAGNOSIS — Z79891 Long term (current) use of opiate analgesic: Secondary | ICD-10-CM | POA: Diagnosis not present

## 2021-09-10 DIAGNOSIS — Z96651 Presence of right artificial knee joint: Secondary | ICD-10-CM | POA: Diagnosis not present

## 2021-09-10 DIAGNOSIS — T84092A Other mechanical complication of internal right knee prosthesis, initial encounter: Secondary | ICD-10-CM | POA: Diagnosis not present

## 2021-09-10 DIAGNOSIS — T84062A Wear of articular bearing surface of internal prosthetic right knee joint, initial encounter: Secondary | ICD-10-CM | POA: Diagnosis not present

## 2021-09-10 DIAGNOSIS — Z01818 Encounter for other preprocedural examination: Secondary | ICD-10-CM | POA: Diagnosis not present

## 2021-09-10 DIAGNOSIS — Z7982 Long term (current) use of aspirin: Secondary | ICD-10-CM | POA: Diagnosis not present

## 2021-09-10 DIAGNOSIS — F419 Anxiety disorder, unspecified: Secondary | ICD-10-CM | POA: Diagnosis not present

## 2021-09-10 DIAGNOSIS — T84012A Broken internal right knee prosthesis, initial encounter: Secondary | ICD-10-CM | POA: Diagnosis not present

## 2021-09-10 DIAGNOSIS — Z79890 Hormone replacement therapy (postmenopausal): Secondary | ICD-10-CM | POA: Diagnosis not present

## 2021-09-10 DIAGNOSIS — R008 Other abnormalities of heart beat: Secondary | ICD-10-CM | POA: Diagnosis not present

## 2021-09-10 DIAGNOSIS — T84093A Other mechanical complication of internal left knee prosthesis, initial encounter: Secondary | ICD-10-CM | POA: Diagnosis not present

## 2021-09-10 DIAGNOSIS — E669 Obesity, unspecified: Secondary | ICD-10-CM | POA: Diagnosis not present

## 2021-09-10 DIAGNOSIS — M25561 Pain in right knee: Secondary | ICD-10-CM | POA: Diagnosis not present

## 2021-09-10 DIAGNOSIS — L4050 Arthropathic psoriasis, unspecified: Secondary | ICD-10-CM | POA: Diagnosis not present

## 2021-09-10 DIAGNOSIS — T8484XD Pain due to internal orthopedic prosthetic devices, implants and grafts, subsequent encounter: Secondary | ICD-10-CM | POA: Diagnosis not present

## 2021-09-13 DIAGNOSIS — M1711 Unilateral primary osteoarthritis, right knee: Secondary | ICD-10-CM | POA: Diagnosis not present

## 2021-09-13 DIAGNOSIS — M25561 Pain in right knee: Secondary | ICD-10-CM | POA: Diagnosis not present

## 2021-09-13 DIAGNOSIS — R262 Difficulty in walking, not elsewhere classified: Secondary | ICD-10-CM | POA: Diagnosis not present

## 2021-09-13 DIAGNOSIS — Z96651 Presence of right artificial knee joint: Secondary | ICD-10-CM | POA: Diagnosis not present

## 2021-09-13 DIAGNOSIS — R29898 Other symptoms and signs involving the musculoskeletal system: Secondary | ICD-10-CM | POA: Diagnosis not present

## 2021-09-16 DIAGNOSIS — Z471 Aftercare following joint replacement surgery: Secondary | ICD-10-CM | POA: Diagnosis not present

## 2021-09-16 DIAGNOSIS — R262 Difficulty in walking, not elsewhere classified: Secondary | ICD-10-CM | POA: Diagnosis not present

## 2021-09-16 DIAGNOSIS — M25561 Pain in right knee: Secondary | ICD-10-CM | POA: Diagnosis not present

## 2021-09-16 DIAGNOSIS — M25661 Stiffness of right knee, not elsewhere classified: Secondary | ICD-10-CM | POA: Diagnosis not present

## 2021-09-18 DIAGNOSIS — R262 Difficulty in walking, not elsewhere classified: Secondary | ICD-10-CM | POA: Diagnosis not present

## 2021-09-18 DIAGNOSIS — Z471 Aftercare following joint replacement surgery: Secondary | ICD-10-CM | POA: Diagnosis not present

## 2021-09-18 DIAGNOSIS — M25561 Pain in right knee: Secondary | ICD-10-CM | POA: Diagnosis not present

## 2021-09-18 DIAGNOSIS — M25661 Stiffness of right knee, not elsewhere classified: Secondary | ICD-10-CM | POA: Diagnosis not present

## 2021-09-19 DIAGNOSIS — M25661 Stiffness of right knee, not elsewhere classified: Secondary | ICD-10-CM | POA: Diagnosis not present

## 2021-09-19 DIAGNOSIS — M25561 Pain in right knee: Secondary | ICD-10-CM | POA: Diagnosis not present

## 2021-09-19 DIAGNOSIS — R262 Difficulty in walking, not elsewhere classified: Secondary | ICD-10-CM | POA: Diagnosis not present

## 2021-09-19 DIAGNOSIS — Z471 Aftercare following joint replacement surgery: Secondary | ICD-10-CM | POA: Diagnosis not present

## 2021-09-23 DIAGNOSIS — M25661 Stiffness of right knee, not elsewhere classified: Secondary | ICD-10-CM | POA: Diagnosis not present

## 2021-09-23 DIAGNOSIS — M25561 Pain in right knee: Secondary | ICD-10-CM | POA: Diagnosis not present

## 2021-09-23 DIAGNOSIS — Z471 Aftercare following joint replacement surgery: Secondary | ICD-10-CM | POA: Diagnosis not present

## 2021-09-23 DIAGNOSIS — R262 Difficulty in walking, not elsewhere classified: Secondary | ICD-10-CM | POA: Diagnosis not present

## 2021-09-25 DIAGNOSIS — M25661 Stiffness of right knee, not elsewhere classified: Secondary | ICD-10-CM | POA: Diagnosis not present

## 2021-09-25 DIAGNOSIS — M25561 Pain in right knee: Secondary | ICD-10-CM | POA: Diagnosis not present

## 2021-09-25 DIAGNOSIS — R262 Difficulty in walking, not elsewhere classified: Secondary | ICD-10-CM | POA: Diagnosis not present

## 2021-09-25 DIAGNOSIS — Z471 Aftercare following joint replacement surgery: Secondary | ICD-10-CM | POA: Diagnosis not present

## 2021-09-27 DIAGNOSIS — Z471 Aftercare following joint replacement surgery: Secondary | ICD-10-CM | POA: Diagnosis not present

## 2021-09-27 DIAGNOSIS — M25561 Pain in right knee: Secondary | ICD-10-CM | POA: Diagnosis not present

## 2021-09-27 DIAGNOSIS — R262 Difficulty in walking, not elsewhere classified: Secondary | ICD-10-CM | POA: Diagnosis not present

## 2021-09-27 DIAGNOSIS — M25661 Stiffness of right knee, not elsewhere classified: Secondary | ICD-10-CM | POA: Diagnosis not present

## 2021-09-30 DIAGNOSIS — Z471 Aftercare following joint replacement surgery: Secondary | ICD-10-CM | POA: Diagnosis not present

## 2021-09-30 DIAGNOSIS — M25661 Stiffness of right knee, not elsewhere classified: Secondary | ICD-10-CM | POA: Diagnosis not present

## 2021-09-30 DIAGNOSIS — R262 Difficulty in walking, not elsewhere classified: Secondary | ICD-10-CM | POA: Diagnosis not present

## 2021-09-30 DIAGNOSIS — M25561 Pain in right knee: Secondary | ICD-10-CM | POA: Diagnosis not present

## 2021-10-02 DIAGNOSIS — M25661 Stiffness of right knee, not elsewhere classified: Secondary | ICD-10-CM | POA: Diagnosis not present

## 2021-10-02 DIAGNOSIS — Z471 Aftercare following joint replacement surgery: Secondary | ICD-10-CM | POA: Diagnosis not present

## 2021-10-02 DIAGNOSIS — M25561 Pain in right knee: Secondary | ICD-10-CM | POA: Diagnosis not present

## 2021-10-02 DIAGNOSIS — R262 Difficulty in walking, not elsewhere classified: Secondary | ICD-10-CM | POA: Diagnosis not present

## 2021-10-04 DIAGNOSIS — M25661 Stiffness of right knee, not elsewhere classified: Secondary | ICD-10-CM | POA: Diagnosis not present

## 2021-10-04 DIAGNOSIS — M25561 Pain in right knee: Secondary | ICD-10-CM | POA: Diagnosis not present

## 2021-10-04 DIAGNOSIS — Z471 Aftercare following joint replacement surgery: Secondary | ICD-10-CM | POA: Diagnosis not present

## 2021-10-04 DIAGNOSIS — R262 Difficulty in walking, not elsewhere classified: Secondary | ICD-10-CM | POA: Diagnosis not present

## 2021-10-07 DIAGNOSIS — M25561 Pain in right knee: Secondary | ICD-10-CM | POA: Diagnosis not present

## 2021-10-07 DIAGNOSIS — M25661 Stiffness of right knee, not elsewhere classified: Secondary | ICD-10-CM | POA: Diagnosis not present

## 2021-10-07 DIAGNOSIS — R262 Difficulty in walking, not elsewhere classified: Secondary | ICD-10-CM | POA: Diagnosis not present

## 2021-10-07 DIAGNOSIS — Z471 Aftercare following joint replacement surgery: Secondary | ICD-10-CM | POA: Diagnosis not present

## 2021-10-09 DIAGNOSIS — M25561 Pain in right knee: Secondary | ICD-10-CM | POA: Diagnosis not present

## 2021-10-09 DIAGNOSIS — M25661 Stiffness of right knee, not elsewhere classified: Secondary | ICD-10-CM | POA: Diagnosis not present

## 2021-10-09 DIAGNOSIS — Z471 Aftercare following joint replacement surgery: Secondary | ICD-10-CM | POA: Diagnosis not present

## 2021-10-09 DIAGNOSIS — R262 Difficulty in walking, not elsewhere classified: Secondary | ICD-10-CM | POA: Diagnosis not present

## 2021-10-11 DIAGNOSIS — M25661 Stiffness of right knee, not elsewhere classified: Secondary | ICD-10-CM | POA: Diagnosis not present

## 2021-10-11 DIAGNOSIS — Z471 Aftercare following joint replacement surgery: Secondary | ICD-10-CM | POA: Diagnosis not present

## 2021-10-11 DIAGNOSIS — R262 Difficulty in walking, not elsewhere classified: Secondary | ICD-10-CM | POA: Diagnosis not present

## 2021-10-11 DIAGNOSIS — M25561 Pain in right knee: Secondary | ICD-10-CM | POA: Diagnosis not present

## 2021-10-14 DIAGNOSIS — Z471 Aftercare following joint replacement surgery: Secondary | ICD-10-CM | POA: Diagnosis not present

## 2021-10-14 DIAGNOSIS — R262 Difficulty in walking, not elsewhere classified: Secondary | ICD-10-CM | POA: Diagnosis not present

## 2021-10-14 DIAGNOSIS — M25661 Stiffness of right knee, not elsewhere classified: Secondary | ICD-10-CM | POA: Diagnosis not present

## 2021-10-14 DIAGNOSIS — M25561 Pain in right knee: Secondary | ICD-10-CM | POA: Diagnosis not present

## 2021-10-16 DIAGNOSIS — G4733 Obstructive sleep apnea (adult) (pediatric): Secondary | ICD-10-CM | POA: Diagnosis not present

## 2021-10-16 DIAGNOSIS — Z471 Aftercare following joint replacement surgery: Secondary | ICD-10-CM | POA: Diagnosis not present

## 2021-10-16 DIAGNOSIS — M25561 Pain in right knee: Secondary | ICD-10-CM | POA: Diagnosis not present

## 2021-10-16 DIAGNOSIS — M25661 Stiffness of right knee, not elsewhere classified: Secondary | ICD-10-CM | POA: Diagnosis not present

## 2021-10-16 DIAGNOSIS — R262 Difficulty in walking, not elsewhere classified: Secondary | ICD-10-CM | POA: Diagnosis not present

## 2021-10-18 DIAGNOSIS — R262 Difficulty in walking, not elsewhere classified: Secondary | ICD-10-CM | POA: Diagnosis not present

## 2021-10-18 DIAGNOSIS — Z471 Aftercare following joint replacement surgery: Secondary | ICD-10-CM | POA: Diagnosis not present

## 2021-10-18 DIAGNOSIS — M25561 Pain in right knee: Secondary | ICD-10-CM | POA: Diagnosis not present

## 2021-10-18 DIAGNOSIS — M25661 Stiffness of right knee, not elsewhere classified: Secondary | ICD-10-CM | POA: Diagnosis not present

## 2021-10-21 DIAGNOSIS — Z96651 Presence of right artificial knee joint: Secondary | ICD-10-CM | POA: Diagnosis not present

## 2021-11-25 DIAGNOSIS — Z6833 Body mass index (BMI) 33.0-33.9, adult: Secondary | ICD-10-CM | POA: Diagnosis not present

## 2021-11-25 DIAGNOSIS — L4050 Arthropathic psoriasis, unspecified: Secondary | ICD-10-CM | POA: Diagnosis not present

## 2021-11-25 DIAGNOSIS — I10 Essential (primary) hypertension: Secondary | ICD-10-CM | POA: Diagnosis not present

## 2021-11-25 DIAGNOSIS — G4733 Obstructive sleep apnea (adult) (pediatric): Secondary | ICD-10-CM | POA: Diagnosis not present

## 2021-11-28 DIAGNOSIS — Z6831 Body mass index (BMI) 31.0-31.9, adult: Secondary | ICD-10-CM | POA: Diagnosis not present

## 2021-11-28 DIAGNOSIS — I10 Essential (primary) hypertension: Secondary | ICD-10-CM | POA: Diagnosis not present

## 2021-11-28 DIAGNOSIS — N183 Chronic kidney disease, stage 3 unspecified: Secondary | ICD-10-CM | POA: Diagnosis not present

## 2021-11-28 DIAGNOSIS — Z125 Encounter for screening for malignant neoplasm of prostate: Secondary | ICD-10-CM | POA: Diagnosis not present

## 2021-11-28 DIAGNOSIS — I498 Other specified cardiac arrhythmias: Secondary | ICD-10-CM | POA: Diagnosis not present

## 2021-11-28 DIAGNOSIS — E039 Hypothyroidism, unspecified: Secondary | ICD-10-CM | POA: Diagnosis not present

## 2022-03-17 ENCOUNTER — Encounter: Payer: Self-pay | Admitting: *Deleted

## 2022-06-05 ENCOUNTER — Encounter: Payer: Self-pay | Admitting: *Deleted
